# Patient Record
Sex: Male | Born: 1955 | Race: Black or African American | Hispanic: No | Smoking: Former smoker
Health system: Southern US, Community
[De-identification: ages and names within clinical notes are randomized; demographics above are authoritative.]

## PROBLEM LIST (undated history)

## (undated) DIAGNOSIS — Z8051 Family history of malignant neoplasm of kidney: Secondary | ICD-10-CM

## (undated) DIAGNOSIS — C801 Malignant (primary) neoplasm, unspecified: Secondary | ICD-10-CM

## (undated) DIAGNOSIS — N189 Chronic kidney disease, unspecified: Secondary | ICD-10-CM

## (undated) HISTORY — PX: NEPHRECTOMY: SHX65

---

## 2003-10-12 ENCOUNTER — Emergency Department (HOSPITAL_COMMUNITY): Admission: EM | Admit: 2003-10-12 | Discharge: 2003-10-12 | Payer: Self-pay | Admitting: Emergency Medicine

## 2004-05-31 DIAGNOSIS — C801 Malignant (primary) neoplasm, unspecified: Secondary | ICD-10-CM

## 2004-05-31 HISTORY — DX: Malignant (primary) neoplasm, unspecified: C80.1

## 2004-05-31 HISTORY — PX: OTHER SURGICAL HISTORY: SHX169

## 2006-05-31 HISTORY — PX: OTHER SURGICAL HISTORY: SHX169

## 2007-06-29 ENCOUNTER — Inpatient Hospital Stay (HOSPITAL_COMMUNITY): Admission: EM | Admit: 2007-06-29 | Discharge: 2007-07-11 | Payer: Self-pay | Admitting: Emergency Medicine

## 2007-07-06 ENCOUNTER — Other Ambulatory Visit: Payer: Self-pay | Admitting: Urology

## 2007-07-06 ENCOUNTER — Encounter (INDEPENDENT_AMBULATORY_CARE_PROVIDER_SITE_OTHER): Payer: Self-pay | Admitting: Urology

## 2007-07-07 ENCOUNTER — Other Ambulatory Visit: Payer: Self-pay | Admitting: Urology

## 2007-07-08 ENCOUNTER — Other Ambulatory Visit: Payer: Self-pay | Admitting: Urology

## 2007-07-10 ENCOUNTER — Ambulatory Visit: Payer: Self-pay | Admitting: Oncology

## 2007-07-19 ENCOUNTER — Ambulatory Visit: Payer: Self-pay | Admitting: Internal Medicine

## 2007-07-20 LAB — CBC WITH DIFFERENTIAL/PLATELET
Basophils Absolute: 0 10*3/uL (ref 0.0–0.1)
EOS%: 1.9 % (ref 0.0–7.0)
Eosinophils Absolute: 0.2 10*3/uL (ref 0.0–0.5)
HCT: 35.2 % — ABNORMAL LOW (ref 38.7–49.9)
HGB: 12.3 g/dL — ABNORMAL LOW (ref 13.0–17.1)
LYMPH%: 16.3 % (ref 14.0–48.0)
MCH: 33.1 pg (ref 28.0–33.4)
MCV: 94.3 fL (ref 81.6–98.0)
MONO%: 7.7 % (ref 0.0–13.0)
NEUT#: 7.2 10*3/uL — ABNORMAL HIGH (ref 1.5–6.5)
NEUT%: 74 % (ref 40.0–75.0)
Platelets: 318 10*3/uL (ref 145–400)

## 2007-07-20 LAB — COMPREHENSIVE METABOLIC PANEL
AST: 20 U/L (ref 0–37)
Albumin: 4 g/dL (ref 3.5–5.2)
Alkaline Phosphatase: 85 U/L (ref 39–117)
BUN: 15 mg/dL (ref 6–23)
Creatinine, Ser: 1.65 mg/dL — ABNORMAL HIGH (ref 0.40–1.50)
Glucose, Bld: 71 mg/dL (ref 70–99)
Potassium: 4.3 mEq/L (ref 3.5–5.3)

## 2007-07-24 ENCOUNTER — Ambulatory Visit: Payer: Self-pay | Admitting: *Deleted

## 2007-07-27 ENCOUNTER — Ambulatory Visit: Payer: Self-pay | Admitting: Thoracic Surgery

## 2007-08-01 ENCOUNTER — Ambulatory Visit (HOSPITAL_COMMUNITY): Admission: RE | Admit: 2007-08-01 | Discharge: 2007-08-01 | Payer: Self-pay | Admitting: Thoracic Surgery

## 2007-08-02 ENCOUNTER — Ambulatory Visit: Payer: Self-pay | Admitting: Thoracic Surgery

## 2007-08-07 ENCOUNTER — Emergency Department (HOSPITAL_COMMUNITY): Admission: EM | Admit: 2007-08-07 | Discharge: 2007-08-07 | Payer: Self-pay | Admitting: Emergency Medicine

## 2007-08-10 ENCOUNTER — Ambulatory Visit: Payer: Self-pay | Admitting: Internal Medicine

## 2007-08-10 LAB — CONVERTED CEMR LAB
ALT: 17 units/L (ref 0–53)
BUN: 13 mg/dL (ref 6–23)
CO2: 27 meq/L (ref 19–32)
Calcium: 9.2 mg/dL (ref 8.4–10.5)
Chloride: 102 meq/L (ref 96–112)
Cholesterol: 179 mg/dL (ref 0–200)
Creatinine, Ser: 1.29 mg/dL (ref 0.40–1.50)
Hep B Core Total Ab: NEGATIVE
Hep B E Ab: NEGATIVE
Total CHOL/HDL Ratio: 4.1

## 2007-08-15 ENCOUNTER — Encounter: Payer: Self-pay | Admitting: Thoracic Surgery

## 2007-08-15 ENCOUNTER — Inpatient Hospital Stay (HOSPITAL_COMMUNITY): Admission: RE | Admit: 2007-08-15 | Discharge: 2007-08-20 | Payer: Self-pay | Admitting: Thoracic Surgery

## 2007-08-16 ENCOUNTER — Ambulatory Visit: Payer: Self-pay | Admitting: Oncology

## 2007-08-17 ENCOUNTER — Ambulatory Visit: Payer: Self-pay | Admitting: Thoracic Surgery

## 2007-08-24 ENCOUNTER — Encounter: Admission: RE | Admit: 2007-08-24 | Discharge: 2007-08-24 | Payer: Self-pay | Admitting: Thoracic Surgery

## 2007-08-24 ENCOUNTER — Ambulatory Visit: Payer: Self-pay | Admitting: Thoracic Surgery

## 2007-09-07 LAB — CBC WITH DIFFERENTIAL/PLATELET
BASO%: 0.2 % (ref 0.0–2.0)
Basophils Absolute: 0 10*3/uL (ref 0.0–0.1)
HCT: 36.3 % — ABNORMAL LOW (ref 38.7–49.9)
HGB: 12.4 g/dL — ABNORMAL LOW (ref 13.0–17.1)
LYMPH%: 19 % (ref 14.0–48.0)
MCHC: 34.2 g/dL (ref 32.0–35.9)
MONO#: 0.7 10*3/uL (ref 0.1–0.9)
NEUT%: 70 % (ref 40.0–75.0)
Platelets: 352 10*3/uL (ref 145–400)
WBC: 8.3 10*3/uL (ref 4.0–10.0)
lymph#: 1.6 10*3/uL (ref 0.9–3.3)

## 2007-09-07 LAB — COMPREHENSIVE METABOLIC PANEL
ALT: 15 U/L (ref 0–53)
AST: 12 U/L (ref 0–37)
Alkaline Phosphatase: 77 U/L (ref 39–117)
BUN: 19 mg/dL (ref 6–23)
Calcium: 9.6 mg/dL (ref 8.4–10.5)
Chloride: 103 mEq/L (ref 96–112)
Creatinine, Ser: 1.89 mg/dL — ABNORMAL HIGH (ref 0.40–1.50)

## 2007-09-14 ENCOUNTER — Ambulatory Visit (HOSPITAL_COMMUNITY): Admission: RE | Admit: 2007-09-14 | Discharge: 2007-09-14 | Payer: Self-pay | Admitting: Thoracic Surgery

## 2007-09-14 ENCOUNTER — Ambulatory Visit: Payer: Self-pay | Admitting: Thoracic Surgery

## 2007-09-25 ENCOUNTER — Encounter: Payer: Self-pay | Admitting: Family Medicine

## 2007-09-25 ENCOUNTER — Ambulatory Visit: Payer: Self-pay | Admitting: Family Medicine

## 2007-09-25 LAB — CONVERTED CEMR LAB
Eosinophils Absolute: 0.2 10*3/uL (ref 0.0–0.7)
Eosinophils Relative: 3 % (ref 0–5)
HCT: 34.2 % — ABNORMAL LOW (ref 39.0–52.0)
Hemoglobin: 11.4 g/dL — ABNORMAL LOW (ref 13.0–17.0)
Lymphs Abs: 1.5 10*3/uL (ref 0.7–4.0)
MCV: 88.8 fL (ref 78.0–100.0)
Monocytes Absolute: 0.6 10*3/uL (ref 0.1–1.0)
Monocytes Relative: 7 % (ref 3–12)
Platelets: 287 10*3/uL (ref 150–400)
WBC: 7.6 10*3/uL (ref 4.0–10.5)

## 2007-10-19 ENCOUNTER — Ambulatory Visit (HOSPITAL_BASED_OUTPATIENT_CLINIC_OR_DEPARTMENT_OTHER): Admission: RE | Admit: 2007-10-19 | Discharge: 2007-10-19 | Payer: Self-pay | Admitting: General Surgery

## 2007-11-01 ENCOUNTER — Ambulatory Visit (HOSPITAL_COMMUNITY): Admission: RE | Admit: 2007-11-01 | Discharge: 2007-11-01 | Payer: Self-pay | Admitting: Thoracic Surgery

## 2007-11-01 ENCOUNTER — Ambulatory Visit: Payer: Self-pay | Admitting: Thoracic Surgery

## 2007-11-17 ENCOUNTER — Emergency Department (HOSPITAL_COMMUNITY): Admission: EM | Admit: 2007-11-17 | Discharge: 2007-11-17 | Payer: Self-pay | Admitting: Emergency Medicine

## 2007-11-27 ENCOUNTER — Ambulatory Visit (HOSPITAL_COMMUNITY): Admission: RE | Admit: 2007-11-27 | Discharge: 2007-11-27 | Payer: Self-pay | Admitting: Orthopedic Surgery

## 2007-12-08 ENCOUNTER — Ambulatory Visit: Payer: Self-pay | Admitting: Oncology

## 2007-12-22 ENCOUNTER — Ambulatory Visit (HOSPITAL_COMMUNITY): Admission: RE | Admit: 2007-12-22 | Discharge: 2007-12-22 | Payer: Self-pay | Admitting: Oncology

## 2008-01-04 LAB — COMPREHENSIVE METABOLIC PANEL
ALT: 52 U/L (ref 0–53)
AST: 58 U/L — ABNORMAL HIGH (ref 0–37)
Albumin: 4.4 g/dL (ref 3.5–5.2)
Alkaline Phosphatase: 60 U/L (ref 39–117)
BUN: 19 mg/dL (ref 6–23)
CO2: 27 mEq/L (ref 19–32)
Calcium: 9.3 mg/dL (ref 8.4–10.5)
Chloride: 100 mEq/L (ref 96–112)
Creatinine, Ser: 1.53 mg/dL — ABNORMAL HIGH (ref 0.40–1.50)
Glucose, Bld: 100 mg/dL — ABNORMAL HIGH (ref 70–99)
Potassium: 4.6 mEq/L (ref 3.5–5.3)
Sodium: 139 mEq/L (ref 135–145)
Total Bilirubin: 1.1 mg/dL (ref 0.3–1.2)
Total Protein: 7.3 g/dL (ref 6.0–8.3)

## 2008-01-04 LAB — CBC WITH DIFFERENTIAL/PLATELET
BASO%: 0.2 % (ref 0.0–2.0)
HCT: 37.3 % — ABNORMAL LOW (ref 38.7–49.9)
LYMPH%: 13 % — ABNORMAL LOW (ref 14.0–48.0)
MCH: 31.9 pg (ref 28.0–33.4)
MCHC: 34.3 g/dL (ref 32.0–35.9)
MCV: 93 fL (ref 81.6–98.0)
MONO%: 8.6 % (ref 0.0–13.0)
NEUT%: 77 % — ABNORMAL HIGH (ref 40.0–75.0)
Platelets: 231 10*3/uL (ref 145–400)
RBC: 4.01 10*6/uL — ABNORMAL LOW (ref 4.20–5.71)

## 2008-01-04 LAB — LACTATE DEHYDROGENASE: LDH: 157 U/L (ref 94–250)

## 2008-02-25 ENCOUNTER — Emergency Department (HOSPITAL_COMMUNITY): Admission: EM | Admit: 2008-02-25 | Discharge: 2008-02-25 | Payer: Self-pay | Admitting: Emergency Medicine

## 2008-04-30 ENCOUNTER — Encounter: Admission: RE | Admit: 2008-04-30 | Discharge: 2008-04-30 | Payer: Self-pay | Admitting: Internal Medicine

## 2008-07-09 ENCOUNTER — Ambulatory Visit: Payer: Self-pay | Admitting: Oncology

## 2009-06-30 IMAGING — CT NM PET TUM IMG SKULL BASE T - THIGH
6 series · 25 of 25 positions shown · IV contrast ([ID])
Comparison: CT dated 06/28/07.

CLINICAL DATA: Assess lung mass. 
 FDG PET-CT TUMOR IMAGING (SKULL BASE TO THIGHS) ? 08/01/07: 
 Fasting Blood Glucose:  99.
TECHNIQUE: 18.0 mCi F18-FDG were administered via the right antecubital fossa.  Full ring PET imaging was performed from the skull base through the mid-thighs 60 minutes after injection.  CT data was obtained and used for attenuation correction and anatomic localization only.  (This was not acquired as a diagnostic CT examination.)

[Series 1: pet ac · axial · 3.3mm · 4.69mm/px · z∈[-870,+0]mm · 5 of 267 slices shown]
[im 1/267]
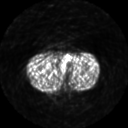
[im 67/267]
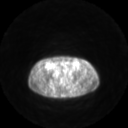
[im 134/267]
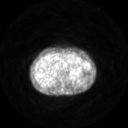
[im 200/267]
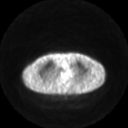
[im 267/267]
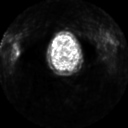

[Series 2: ct images · axial · 3.8mm · 0.98mm/px · z∈[-870,+0]mm · 6 of 267 slices shown]
[im 1/267]
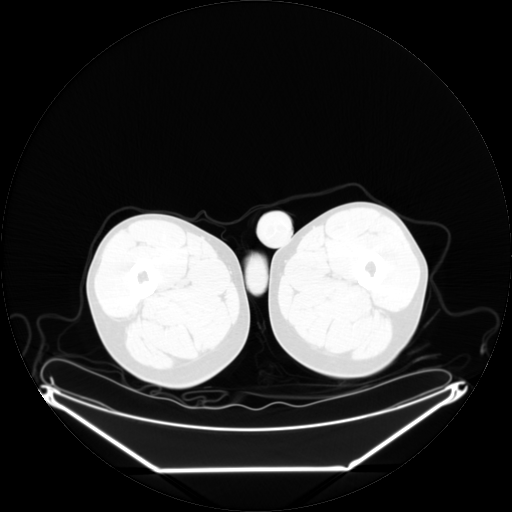
[im 54/267]
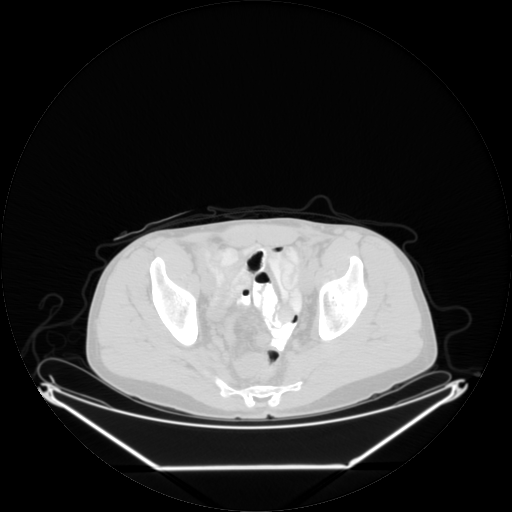
[im 107/267]
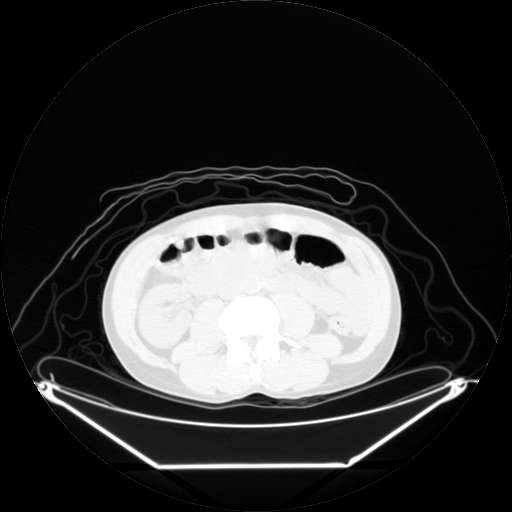
[im 160/267]
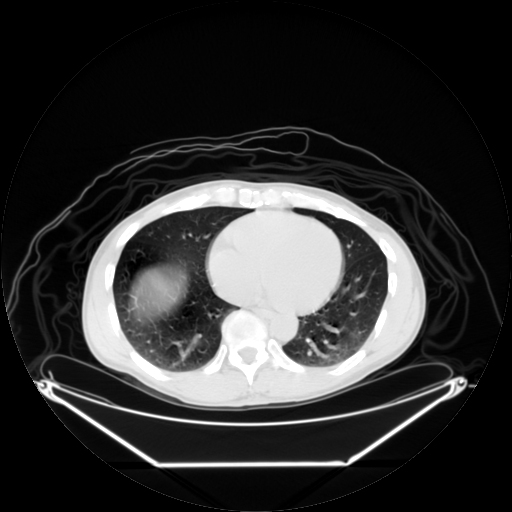
[im 213/267]
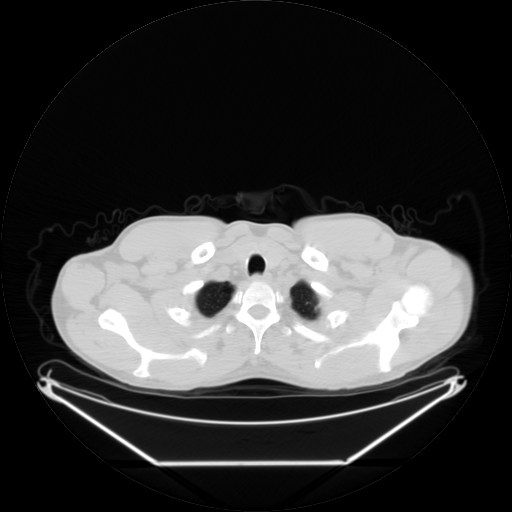
[im 267/267  brain]
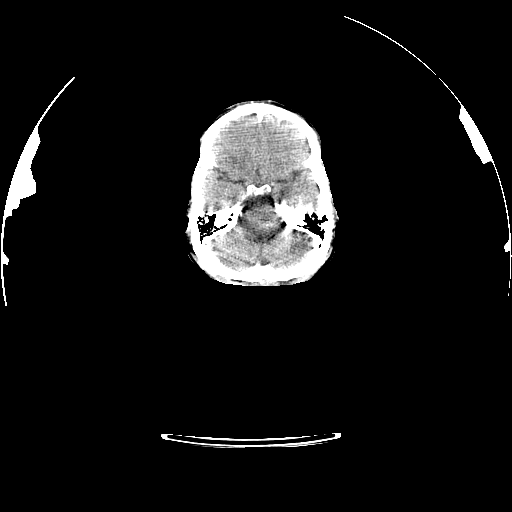

[Series 2: pet nac · axial · 3.3mm · 4.69mm/px · z∈[-870,+0]mm · 6 of 267 slices shown]
[im 1/267]
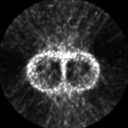
[im 54/267]
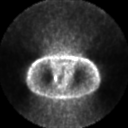
[im 107/267]
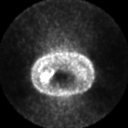
[im 160/267]
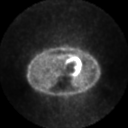
[im 213/267]
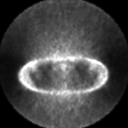
[im 267/267]
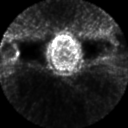

[Series 123: mip · coronal · 3.3mm · 4.69mm/px · 1 of 30 slices shown]
[im 1/30]
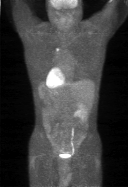

[Series 151: reformatted · axial · 3.3mm · 3.91mm/px · z∈[-870,+0]mm · 6 of 265 slices shown (1 of 2)]
[im 1/265]
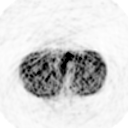
[im 53/265]
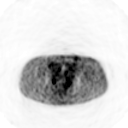
[im 106/265]
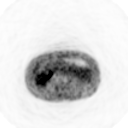
[im 159/265]
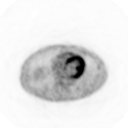
[im 212/265]
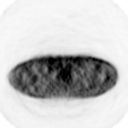
[im 265/265]
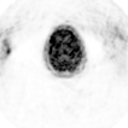

[Series 153: reformatted · coronal · 4.7mm · 6.98mm/px · 1 of 68 slices shown (2 of 2)]
[im 1/68]
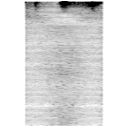

[25 of 25 positions shown; findings below may reference images not displayed]

FINDINGS: There are no hypermetabolic lymph nodes within the soft tissues of the neck.
 There are no hypermetabolic axillary lymph nodes.  
 No hypermetabolic mediastinal or hilar lymph nodes are identified.
 Within the left upper lobe, there is a pulmonary nodule measuring 1.6 cm.  There is abnormal FDG uptake within this nodule with an SUV-max equal to 6.4. 
 There are no additional hypermetabolic pulmonary nodules or masses identified.
 There is no abnormal FDG uptake identified within the liver parenchyma.
 The spleen is negative.
 The adrenal glands are negative.
 The patient is status post left nephrectomy.  
 No hypermetabolic retroperitoneal lymph nodes are identified. 
 There are no hypermetabolic pelvic, or inguinal lymph nodes identified.  
 Review of the visualized axial and appendicular skeleton shows no evidence for hypermetabolic bone metastases.
IMPRESSION: 1.  Pulmonary nodule in the left upper lobe is intensely hypermetabolic concerning for primary lung neoplasm.
 2.  Status post left nephrectomy.

## 2010-06-21 ENCOUNTER — Encounter: Payer: Self-pay | Admitting: Oncology

## 2010-06-22 ENCOUNTER — Encounter: Payer: Self-pay | Admitting: Thoracic Surgery

## 2010-06-22 ENCOUNTER — Encounter: Payer: Self-pay | Admitting: Oncology

## 2010-10-13 NOTE — Assessment & Plan Note (Signed)
OFFICE VISIT   ABDISHAKUR, GOTTSCHALL  DOB:  Jul 27, 1955                                        August 24, 2007  CHART #:  [id number]   [name] returns today after having his wedge resection for a  granulomatous process.  His blood pressure was 152/80, pulse 78,  respirations 18, sats are 100.  His incisions are well healed.  He is  doing well overall.  I will see him back again in 3 weeks with a chest x-  ray and will be seen about his hernia next week.   Ines Bloomer, M.D.  Electronically Signed   DPB/MEDQ  D:  08/24/2007  T:  08/24/2007  Job:  [id number]
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
## 2010-10-13 NOTE — Assessment & Plan Note (Signed)
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  He is on  antituberculous medication.  His incisions are well healed.  His chest x-  ray is stable.   His blood pressure is 128/87, pulse 65, respirations 18, saturations  95%.   I plan to see him back again in 6 weeks with a chest x-ray for final  check.   Ines Bloomer, M.D.  Electronically Signed   DPB/MEDQ  D:  09/14/2007  T:  09/14/2007  Job:  [id number]
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
## 2010-10-13 NOTE — Op Note (Signed)
NAME:  Jeremy [name], Jeremy [name] NO.:  [id number]   MEDICAL RECORD NO.:  [id number]          PATIENT TYPE:  INP   LOCATION:  2550                         FACILITY:  [facility]   PHYSICIAN:  Ines Bloomer, M.D. DATE OF BIRTH:  [birth year]   DATE OF PROCEDURE:  08/15/2007  DATE OF DISCHARGE:                               OPERATIVE REPORT   PREOPERATIVE DIAGNOSIS:  Left upper lobe mass status post resection of  renal cell cancer.   POSTOPERATIVE DIAGNOSIS:  Granulomatous process left upper lobe.   OPERATION PERFORMED:  1. Left VATS wedge resection of the left upper lobe.  2. Mini thoracotomy.  3. Node dissection.   SURGEON:  Dorita Sciara, MD.   FIRST ASSISTANT:  Zadie Rhine, PA-C.   ANESTHESIA:  General.   After percutaneous insertion of all monitoring lines, the patient  underwent general anesthesia, was prepped and draped in the usual  sterile manner.  He was turned to the left lateral thoracotomy position.  A dual lumen tube was inserted.  The left lung was deflated.  Two trocar  sites were made in the anterior and posterior axillary line at the  seventh intercostal space, two trocars were inserted.  Notably, the  patient had evidence of chronic obstructive pulmonary disease but no  evidence of any pleural mets so a 7 cm incision was made over the  triangle with auscultation, partially dividing the latissimus and  entering the fifth intercostal space.  The lesion was palpated in the  anterior segment of the left upper lobe and we wedged this out with  several applications of the Auto Suture 60 green stapler.  This was sent  for frozen section.  We then biopsied several lymph nodes and removed  several lymph nodes including 4L, 11L, 8L, 9L and 10L nodes.  The frozen  section came back a granulomatous process and for this reason we elected  not to do anything else.  CoSeal was applied to the staple line.  Two  chest tubes were placed through the trocar sites and  tied in place with  #0 silk.  A Marcaine block was done in the usual fashion.  A single On-Q  was inserted in the usual fashion.  The chest was closed with two  pericostals, drilling through the 6th rib and passing around the 5th  rib, #1 Vicryl in the muscle layer, #2-0 Vicryl in the subcutaneous  tissue and Dermabond for the skin.  The patient was returned to the  recovery room in stable condition.      Ines Bloomer, M.D.  Electronically Signed    DPB/MEDQ  D:  08/15/2007  T:  08/15/2007  Job:  [id number]   cc:   Lonzo Candy, Dr.
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
## 2010-10-13 NOTE — Assessment & Plan Note (Signed)
OFFICE VISIT   KLEVER, TWYFORD  DOB:  1955-06-28                                        November 01, 2007  CHART #:  [id number]   Mr. Copado came for followup today.  He is still complaining of some  chest wall pain.  His blood pressure is 122/89, pulse 84, respirations  16, and sats were 99%.  Overall, he is doing well.  I will plan to see  him back again in 3 months with a chest x-ray.  He is continuing his TB  treatment.   Ines Bloomer, M.D.  Electronically Signed   DPB/MEDQ  D:  11/01/2007  T:  11/02/2007  Job:  [id number]

## 2010-10-13 NOTE — Letter (Signed)
August 02, 2007   Blenda Nicely. Clelia Croft, MD.  [address] [place], [place].  [place], [place], [address]   Re:  Jeremy Derry                  DOB:   Dear Dr. Clelia Croft:   I saw Jeremy [name] back in the office today, and his PET scan is positive  for the left upper lobe lesion so I think he does have probably either a  metastatic or a non-small cell lung cancer.  He is a candidate for  probably a left upper lobe apical trisegmentectomy or a left upper  lobectomy.  I have tentatively scheduled this for the 17th of March.  Because he is in an assisted living situation and because he was  homeless, we are planning to hopefully get this extended until we can do  his surgery on the 17th and then we will have to make arrangements for  him to go somewhere after his surgery.  I will inform you after his  surgery about the operative findings.  His blood pressure was 155/95,  pulse 72, respirations 18, SATs were 99%.   Ines Bloomer, M.D.  Electronically Signed   DPB/MEDQ  D:  08/02/2007  T:  08/02/2007  Job:  [id number]

## 2010-10-13 NOTE — Op Note (Signed)
NAME:  Jeremy, [name] NO.:  [id number]   MEDICAL RECORD NO.:  [id number]          PATIENT TYPE:  AMB   LOCATION:  DSC                          FACILITY:  [facility]   PHYSICIAN:  Cherylynn Ridges, M.D.    DATE OF BIRTH:  11/11/1955   DATE OF PROCEDURE:  10/19/2007  DATE OF DISCHARGE:                               OPERATIVE REPORT   PREOPERATIVE DIAGNOSIS:  Right inguinal hernia.   POSTOPERATIVE DIAGNOSIS:  Right indirect inguinal hernia.   PROCEDURE:  Right inguinal hernia repair with mesh.   SURGEON:  Marta Lamas. Lindie Spruce, MD.   ANESTHESIA:  General with a laryngeal airway.   ESTIMATED BLOOD LOSS:  Less than 20 mL.   COMPLICATIONS:  None.   CONDITION:  Stable.   FINDINGS:  Large indirect hernia with mild direct defect.   INDICATION FOR OPERATION:  The patient is a 55 year old gentleman with a  previous left inguinal hernia repair, who comes in now with a right  inguinal hernia for repair.   OPERATION:  The patient was taken to the operating room and placed on  table in the supine position.  After an adequate general laryngeal  airway anesthetic was administered, he was prepped and draped in the  usual sterile manner exposing the right groin.   A transverse curvilinear incision, approximately 6 cm long, was made  using a #10 blade and taken down to the subcutaneous tissue.  We  dissected down to the external oblique fascia through Scarpa fascia.  We  opened the external oblique fascia through the superficial ring using  Metzenbaum scissors.  We mobilized spermatic cord at the pubic tubercle  with a Penrose drain.  There did not appeared to be a large direct  defect.  We placed the spermatic cord up on a workbench on top of the  Penrose drain and then dissected out the hernia sac anteromedially.  We  ligated the hernia sac at its base with a 2 suture ligatures of 0  Ethibond suture.  We transected the excess sac and allowed it to retract  underneath the muscle at  the internal ring.  We then sutured in a piece  of oval mesh measuring 2 x 5-cm in size to the pubic tubercle, the  conjoined tendon anteromedially and the reflected portion of the  inguinal ligament inferolaterally.  As this was done with 0 Prolene  sutures, we sutured it up to the internal ring.  After the mesh was in  place, we irrigated with antibiotic solution in which it had been soaked  prior to implanting.  Once this was done, we closed the external oblique  fascia on top of the cord, and repaired using a running 3-0 Vicryl  suture.  Scarpa fascia was reapproximated with interrupted 3-0 Vicryl  sutures.  A 0.5% Marcaine without epinephrine was  injected into the wound and at the anterior superior iliac spine,  approximately 20 mL total were used.  We then closed the skin using  running subcuticular suture of 4-0 Monocryl.  A sterile dressing was  applied including Dermabond, Steri-Strips, and Tegaderm.  All needle  counts, sponge counts, and instrument counts were correct.      Cherylynn Ridges, M.D.  Electronically Signed     JOW/MEDQ  D:  10/19/2007  T:  10/20/2007  Job:  [id number]   cc:   Sharin Grave, MD

## 2010-10-13 NOTE — Assessment & Plan Note (Signed)
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  He has been  [facility] and started on antituberculosis drugs  and will be apparently followed up by their pulmonologist.  They are  checking a sputum on him, but I think this was a nodule.  I doubt if he  was coughing up any infected sputum.  Will see him back again as  scheduled.   Ines Bloomer, M.D.  Electronically Signed   DPB/MEDQ  D:  09/11/2007  T:  09/11/2007  Job:  [id number]

## 2010-10-13 NOTE — Op Note (Signed)
NAME:  Jeremy [name], Jeremy [name]                ACCOUNT NO.:  [id number]   MEDICAL RECORD NO.:  [id number]          PATIENT TYPE:  AMB   LOCATION:  SDS                          FACILITY:  [facility]   PHYSICIAN:  Artist Pais. Weingold, M.D.DATE OF BIRTH:  [birth year]   DATE OF PROCEDURE:  11/27/2007  DATE OF DISCHARGE:  11/27/2007                               OPERATIVE REPORT   PREOPERATIVE DIAGNOSIS:  Left wrist volar laceration, ulnar side.   POSTOPERATIVE DIAGNOSES:  Left wrist volar laceration, ulnar side.   PROCEDURE:  Exploration and repair microscopically, left ulnar nerve at  the wrist and flexor carpi ulnaris tendon.   SURGEON:  Artist Pais. Mina Marble, MD   ASSISTANT:  None.   ANESTHESIA:  General.   TOURNIQUET TIME:  1 hour 3 minutes.   COMPLICATIONS:  None.   DRAINS:  None.   OPERATIVE REPORT:  The patient was taken to the operating suite.  After  induction of adequate general anesthesia, left upper extremity was  prepped and draped in sterile fashion.  An Esmarch was used to  exsanguinated the limb.  Tourniquet was inflated to 250 mm.  At this  point in time, a small transverse laceration just proximal to the wrist  flexion crease on the ulnar side of the wrist was extended proximally  and distally in a gentle S-type fashion.  Flaps were raised accordingly.  Dissection was carried down to the ulnar side of the wrist.  There was a  90% laceration of the flexor carpi ulnaris tendon, again just proximal  to the wrist and a laceration to the ulnar nerve at that same level.  The FCU was carefully retracted to expose the neurovascular bundle.  Neurovascular bundle was dissected free on the ulnar side.  The ulnar  artery was intact, however, the ulnar nerve had a significant complex  laceration.  The ulnar nerve was carefully dissected into 3 main  fascicles, 2 of which he had complete lacerations and 1 had a partial  laceration.  Under loupe magnification, dissection was carried down to  normal tissue proximally and distally.  At this point in time, the  microscope was brought into the field and the complete lacerations  repaired with 9-0 nylon x3 sutures to each fascicles followed by  debridement and completion of the partial nerve injury to the last  fascicle.  This was followed by repair using nylon as well.  The wound  was then irrigated.  The microscope was brought off the field.  The  flexor carpi ulnaris tendon was repaired with 3-0 Ethibond suture and 3  horizontal  mattress sutures followed by irrigation of the wound, hemostasis with  bipolar cautery, and wound closure with 4-0 Vicryl Rapide sutures.  Xeroform, 4x4s, and dorsal extension block splint was applied with the  wrist in flexion to protect the repairs.  The patient tolerated the  procedure well and went to recovery room in stable fashion.      Artist Pais Mina Marble, M.D.  Electronically Signed     MAW/MEDQ  D:  11/27/2007  T:  11/28/2007  Job:  [id number]
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
## 2011-02-18 LAB — URINALYSIS, ROUTINE W REFLEX MICROSCOPIC
Bilirubin Urine: NEGATIVE
Glucose, UA: NEGATIVE
Ketones, ur: NEGATIVE
Leukocytes, UA: NEGATIVE
Protein, ur: 30 — AB

## 2011-02-18 LAB — COMPREHENSIVE METABOLIC PANEL
ALT: 26
CO2: 29
Calcium: 8.5
Creatinine, Ser: 0.84
GFR calc non Af Amer: >60
Glucose, Bld: 103 — ABNORMAL HIGH

## 2011-02-18 LAB — CBC
HCT: 32.8 — ABNORMAL LOW
HCT: 36.1 — ABNORMAL LOW
Hemoglobin: 11.4 — ABNORMAL LOW
Hemoglobin: 12.6 — ABNORMAL LOW
MCHC: 35
MCV: 96.1
MCV: 97.1
Platelets: 178
Platelets: 179
RBC: 3.75 — ABNORMAL LOW
RDW: 15.7 — ABNORMAL HIGH
RDW: 15.8 — ABNORMAL HIGH
WBC: 13.6 — ABNORMAL HIGH
WBC: 8.7

## 2011-02-18 LAB — POCT CARDIAC MARKERS
Myoglobin, poc: 149
Operator id: 234501

## 2011-02-18 LAB — CARDIAC PANEL(CRET KIN+CKTOT+MB+TROPI)
CK, MB: 2.5
Relative Index: 1.4
Total CK: 176
Troponin I: 0.01

## 2011-02-18 LAB — ETHANOL: Alcohol, Ethyl (B): 283 — ABNORMAL HIGH

## 2011-02-18 LAB — BASIC METABOLIC PANEL
BUN: 4 — ABNORMAL LOW
BUN: 5 — ABNORMAL LOW
Calcium: 8.5
Chloride: 104
Creatinine, Ser: 0.84
GFR calc non Af Amer: >60
Glucose, Bld: 176 — ABNORMAL HIGH
Potassium: 3.4 — ABNORMAL LOW
Potassium: 3.5
Sodium: 138

## 2011-02-18 LAB — CK TOTAL AND CKMB (NOT AT ARMC)
CK, MB: 2.9
Relative Index: 1.6
Total CK: 186

## 2011-02-18 LAB — URINE MICROSCOPIC-ADD ON

## 2011-02-18 LAB — DIFFERENTIAL
Eosinophils Absolute: 0
Lymphocytes Relative: 11 — ABNORMAL LOW
Lymphs Abs: 0.9
Neutrophils Relative %: 82 — ABNORMAL HIGH

## 2011-02-18 LAB — TROPONIN I: Troponin I: <0.01

## 2011-02-19 LAB — COMPREHENSIVE METABOLIC PANEL
AST: 110 — ABNORMAL HIGH
AST: 148 — ABNORMAL HIGH
Albumin: 3 — ABNORMAL LOW
Albumin: 3.1 — ABNORMAL LOW
Alkaline Phosphatase: 68
BUN: 9
Calcium: 8.8
Chloride: 102
Creatinine, Ser: 0.8
GFR calc Af Amer: >60
GFR calc Af Amer: >60
Potassium: 3.5
Total Bilirubin: 0.5

## 2011-02-19 LAB — BASIC METABOLIC PANEL
CO2: 28
CO2: 30
CO2: 30
Calcium: 8.4
Calcium: 8.2 — ABNORMAL LOW
Calcium: 8.4
Chloride: 100
Chloride: 104
Chloride: 106
Creatinine, Ser: 1.5
GFR calc Af Amer: >60
GFR calc Af Amer: 59 — ABNORMAL LOW
GFR calc Af Amer: >60
GFR calc Af Amer: >60
GFR calc non Af Amer: 49 — ABNORMAL LOW
GFR calc non Af Amer: >60
Potassium: 4.2
Sodium: 134 — ABNORMAL LOW
Sodium: 135
Sodium: 139

## 2011-02-19 LAB — ABO/RH: ABO/RH(D): O POS

## 2011-02-19 LAB — CBC
HCT: 37.6 — ABNORMAL LOW
HCT: 32.3 — ABNORMAL LOW
Hemoglobin: 11.9 — ABNORMAL LOW
Hemoglobin: 11.3 — ABNORMAL LOW
MCHC: 33.7
MCHC: 34.6
MCHC: 34.9
MCV: 97.1
MCV: 98.5
MCV: 96.4
MCV: 96.7
Platelets: 242
Platelets: 249
Platelets: 247
Platelets: 267
RBC: 3.55 — ABNORMAL LOW
RBC: 3.84 — ABNORMAL LOW
RBC: 3.35 — ABNORMAL LOW
RDW: 15.9 — ABNORMAL HIGH
RDW: 15.1
RDW: 15.2
WBC: 10.7 — ABNORMAL HIGH
WBC: 10.7 — ABNORMAL HIGH
WBC: 11.4 — ABNORMAL HIGH

## 2011-02-19 LAB — TYPE AND SCREEN: Antibody Screen: NEGATIVE

## 2011-02-22 LAB — TYPE AND SCREEN: ABO/RH(D): O POS

## 2011-02-22 LAB — TISSUE CULTURE

## 2011-02-22 LAB — CBC
HCT: 35.5 — ABNORMAL LOW
HCT: 30.9 — ABNORMAL LOW
Hemoglobin: 12 — ABNORMAL LOW
Hemoglobin: 10.4 — ABNORMAL LOW
Hemoglobin: 10.4 — ABNORMAL LOW
Hemoglobin: 11.1 — ABNORMAL LOW
Platelets: 215
Platelets: 212
RBC: 3.28 — ABNORMAL LOW
RBC: 3.3 — ABNORMAL LOW
RDW: 13.7
RDW: 13.3
RDW: 13.5
WBC: 8.6
WBC: 11.2 — ABNORMAL HIGH

## 2011-02-22 LAB — URINALYSIS, ROUTINE W REFLEX MICROSCOPIC
Protein, ur: NEGATIVE
Urobilinogen, UA: 0.2

## 2011-02-22 LAB — BLOOD GAS, ARTERIAL
Bicarbonate: 26.5 — ABNORMAL HIGH
FIO2: 0.21
O2 Content: 2
O2 Saturation: 92.8
Sample type: 181601
TCO2: 27.9
pCO2 arterial: 45.5 — ABNORMAL HIGH
pCO2 arterial: 48.6 — ABNORMAL HIGH
pH, Arterial: 7.384
pO2, Arterial: 66.8 — ABNORMAL LOW

## 2011-02-22 LAB — BASIC METABOLIC PANEL
Calcium: 8.7
Creatinine, Ser: 1.27
GFR calc Af Amer: >60
GFR calc Af Amer: >60
GFR calc non Af Amer: 59 — ABNORMAL LOW
GFR calc non Af Amer: >60
Glucose, Bld: 118 — ABNORMAL HIGH
Potassium: 4.1
Sodium: 136
Sodium: 136

## 2011-02-22 LAB — COMPREHENSIVE METABOLIC PANEL
ALT: 17
ALT: 15
Albumin: 3.5
Alkaline Phosphatase: 61
Alkaline Phosphatase: 54
BUN: 15
BUN: 7
CO2: 31
Chloride: 103
Chloride: 102
Glucose, Bld: 64 — ABNORMAL LOW
Glucose, Bld: 123 — ABNORMAL HIGH
Potassium: 4.5
Potassium: 3.7
Sodium: 136
Sodium: 136
Total Bilirubin: 0.4
Total Bilirubin: 0.6
Total Protein: 6.3

## 2011-02-22 LAB — AFB CULTURE WITH SMEAR (NOT AT ARMC): Acid Fast Smear: NONE SEEN

## 2011-02-22 LAB — PROTIME-INR
INR: 0.9
Prothrombin Time: 12.3

## 2011-02-22 LAB — FUNGUS CULTURE W SMEAR

## 2011-02-24 LAB — DIFFERENTIAL
Basophils Absolute: 0
Eosinophils Relative: 1
Lymphocytes Relative: 19
Monocytes Absolute: 0.6
Monocytes Relative: 11
Neutro Abs: 3.7

## 2011-02-24 LAB — BASIC METABOLIC PANEL
CO2: 27
Calcium: 9.3
GFR calc Af Amer: >60
GFR calc non Af Amer: >60
Glucose, Bld: 93
Potassium: 4.8
Sodium: 136

## 2011-02-24 LAB — CBC
HCT: 36.9 — ABNORMAL LOW
Hemoglobin: 12.7 — ABNORMAL LOW
RBC: 4.06 — ABNORMAL LOW
RDW: 14.1

## 2011-02-25 LAB — BASIC METABOLIC PANEL
BUN: 11
CO2: 29
Calcium: 9.7
Chloride: 102
Creatinine, Ser: 1.48
GFR calc Af Amer: >60
GFR calc non Af Amer: 50 — ABNORMAL LOW
Glucose, Bld: 85
Potassium: 4.4
Sodium: 139

## 2011-02-25 LAB — CBC
HCT: 37.8 — ABNORMAL LOW
Hemoglobin: 12.7 — ABNORMAL LOW
MCHC: 33.6
MCV: 94.7
Platelets: 226
RBC: 3.99 — ABNORMAL LOW
RDW: 13.6
WBC: 5.5

## 2011-03-01 LAB — CBC
Hemoglobin: 11.4 — ABNORMAL LOW
MCV: 98
RBC: 3.42 — ABNORMAL LOW
WBC: 6.6

## 2011-03-01 LAB — DIFFERENTIAL
Eosinophils Absolute: 0
Lymphs Abs: 0.7
Monocytes Absolute: 0.9
Monocytes Relative: 14 — ABNORMAL HIGH
Neutro Abs: 5
Neutrophils Relative %: 76

## 2011-03-01 LAB — BASIC METABOLIC PANEL
CO2: 26
Chloride: 105
Creatinine, Ser: 1.16
GFR calc Af Amer: >60
Sodium: 138

## 2011-03-01 LAB — RAPID URINE DRUG SCREEN, HOSP PERFORMED
Barbiturates: NOT DETECTED
Benzodiazepines: NOT DETECTED

## 2011-03-01 LAB — ETHANOL: Alcohol, Ethyl (B): <5

## 2014-11-29 DEATH — deceased

## 2015-02-28 ENCOUNTER — Emergency Department (HOSPITAL_COMMUNITY): Payer: MEDICAID

## 2015-02-28 ENCOUNTER — Inpatient Hospital Stay (HOSPITAL_COMMUNITY)
Admission: EM | Admit: 2015-02-28 | Discharge: 2015-03-10 | DRG: 438 | Disposition: A | Discharge status: Other Acute Inpatient Hospital | Payer: Self-pay | Attending: Internal Medicine | Admitting: Internal Medicine

## 2015-02-28 ENCOUNTER — Emergency Department (HOSPITAL_COMMUNITY): Payer: Self-pay

## 2015-02-28 ENCOUNTER — Encounter (HOSPITAL_COMMUNITY): Payer: Self-pay | Admitting: Emergency Medicine

## 2015-02-28 DIAGNOSIS — K852 Alcohol induced acute pancreatitis without necrosis or infection: Principal | ICD-10-CM | POA: Diagnosis present

## 2015-02-28 DIAGNOSIS — D72829 Elevated white blood cell count, unspecified: Secondary | ICD-10-CM | POA: Diagnosis present

## 2015-02-28 DIAGNOSIS — K920 Hematemesis: Secondary | ICD-10-CM | POA: Insufficient documentation

## 2015-02-28 DIAGNOSIS — Z85528 Personal history of other malignant neoplasm of kidney: Secondary | ICD-10-CM

## 2015-02-28 DIAGNOSIS — E162 Hypoglycemia, unspecified: Secondary | ICD-10-CM | POA: Diagnosis present

## 2015-02-28 DIAGNOSIS — E860 Dehydration: Secondary | ICD-10-CM | POA: Diagnosis present

## 2015-02-28 DIAGNOSIS — K7200 Acute and subacute hepatic failure without coma: Secondary | ICD-10-CM | POA: Diagnosis present

## 2015-02-28 DIAGNOSIS — E872 Acidosis, unspecified: Secondary | ICD-10-CM | POA: Diagnosis present

## 2015-02-28 DIAGNOSIS — I10 Essential (primary) hypertension: Secondary | ICD-10-CM | POA: Diagnosis present

## 2015-02-28 DIAGNOSIS — E8729 Other acidosis: Secondary | ICD-10-CM | POA: Diagnosis present

## 2015-02-28 DIAGNOSIS — K858 Other acute pancreatitis without necrosis or infection: Secondary | ICD-10-CM

## 2015-02-28 DIAGNOSIS — K921 Melena: Secondary | ICD-10-CM

## 2015-02-28 DIAGNOSIS — F1010 Alcohol abuse, uncomplicated: Secondary | ICD-10-CM | POA: Diagnosis present

## 2015-02-28 DIAGNOSIS — E861 Hypovolemia: Secondary | ICD-10-CM | POA: Diagnosis present

## 2015-02-28 DIAGNOSIS — K7011 Alcoholic hepatitis with ascites: Secondary | ICD-10-CM | POA: Diagnosis present

## 2015-02-28 DIAGNOSIS — Z902 Acquired absence of lung [part of]: Secondary | ICD-10-CM

## 2015-02-28 DIAGNOSIS — N179 Acute kidney failure, unspecified: Secondary | ICD-10-CM | POA: Diagnosis present

## 2015-02-28 DIAGNOSIS — K838 Other specified diseases of biliary tract: Secondary | ICD-10-CM

## 2015-02-28 DIAGNOSIS — K7040 Alcoholic hepatic failure without coma: Secondary | ICD-10-CM

## 2015-02-28 DIAGNOSIS — K269 Duodenal ulcer, unspecified as acute or chronic, without hemorrhage or perforation: Secondary | ICD-10-CM | POA: Diagnosis present

## 2015-02-28 DIAGNOSIS — W19XXXA Unspecified fall, initial encounter: Secondary | ICD-10-CM | POA: Diagnosis present

## 2015-02-28 DIAGNOSIS — K7010 Alcoholic hepatitis without ascites: Secondary | ICD-10-CM | POA: Diagnosis present

## 2015-02-28 DIAGNOSIS — D6959 Other secondary thrombocytopenia: Secondary | ICD-10-CM | POA: Diagnosis present

## 2015-02-28 DIAGNOSIS — Z833 Family history of diabetes mellitus: Secondary | ICD-10-CM

## 2015-02-28 DIAGNOSIS — K859 Acute pancreatitis without necrosis or infection, unspecified: Secondary | ICD-10-CM | POA: Diagnosis present

## 2015-02-28 DIAGNOSIS — R945 Abnormal results of liver function studies: Secondary | ICD-10-CM

## 2015-02-28 DIAGNOSIS — Z9114 Patient's other noncompliance with medication regimen: Secondary | ICD-10-CM

## 2015-02-28 DIAGNOSIS — Y902 Blood alcohol level of 40-59 mg/100 ml: Secondary | ICD-10-CM

## 2015-02-28 DIAGNOSIS — Z6821 Body mass index (BMI) 21.0-21.9, adult: Secondary | ICD-10-CM

## 2015-02-28 DIAGNOSIS — Z87891 Personal history of nicotine dependence: Secondary | ICD-10-CM

## 2015-02-28 DIAGNOSIS — K759 Inflammatory liver disease, unspecified: Secondary | ICD-10-CM

## 2015-02-28 DIAGNOSIS — Z905 Acquired absence of kidney: Secondary | ICD-10-CM

## 2015-02-28 DIAGNOSIS — R45851 Suicidal ideations: Secondary | ICD-10-CM | POA: Diagnosis present

## 2015-02-28 DIAGNOSIS — E43 Unspecified severe protein-calorie malnutrition: Secondary | ICD-10-CM | POA: Diagnosis present

## 2015-02-28 DIAGNOSIS — R7989 Other specified abnormal findings of blood chemistry: Secondary | ICD-10-CM

## 2015-02-28 DIAGNOSIS — Z590 Homelessness: Secondary | ICD-10-CM

## 2015-02-28 DIAGNOSIS — E876 Hypokalemia: Secondary | ICD-10-CM | POA: Diagnosis present

## 2015-02-28 DIAGNOSIS — F10239 Alcohol dependence with withdrawal, unspecified: Secondary | ICD-10-CM | POA: Diagnosis present

## 2015-02-28 HISTORY — DX: Family history of malignant neoplasm of kidney: Z80.51

## 2015-02-28 HISTORY — DX: Malignant (primary) neoplasm, unspecified: C80.1

## 2015-02-28 HISTORY — DX: Chronic kidney disease, unspecified: N18.9

## 2015-02-28 LAB — BASIC METABOLIC PANEL
ANION GAP: 23 — AB (ref 5–15)
BUN: 22 mg/dL — AB (ref 6–20)
CALCIUM: 7.4 mg/dL — AB (ref 8.9–10.3)
CO2: 13 mmol/L — ABNORMAL LOW (ref 22–32)
Chloride: 99 mmol/L — ABNORMAL LOW (ref 101–111)
Creatinine, Ser: 1.54 mg/dL — ABNORMAL HIGH (ref 0.61–1.24)
GFR calc Af Amer: 54 mL/min — ABNORMAL LOW (ref >60)
GFR, EST NON AFRICAN AMERICAN: 47 mL/min — AB (ref >60)
GLUCOSE: 111 mg/dL — AB (ref 65–99)
Potassium: 5.3 mmol/L — ABNORMAL HIGH (ref 3.5–5.1)
SODIUM: 135 mmol/L (ref 135–145)

## 2015-02-28 LAB — CBC WITH DIFFERENTIAL/PLATELET
BASOS ABS: 0 10*3/uL (ref 0.0–0.1)
Basophils Relative: 0 %
EOS ABS: 0 10*3/uL (ref 0.0–0.7)
Eosinophils Relative: 0 %
HCT: 37.8 % — ABNORMAL LOW (ref 39.0–52.0)
Hemoglobin: 13 g/dL (ref 13.0–17.0)
LYMPHS ABS: 1.8 10*3/uL (ref 0.7–4.0)
Lymphocytes Relative: 8 %
MCH: 33.8 pg (ref 26.0–34.0)
MCHC: 34.4 g/dL (ref 30.0–36.0)
MCV: 98.2 fL (ref 78.0–100.0)
MONO ABS: 1.3 10*3/uL — AB (ref 0.1–1.0)
Monocytes Relative: 6 %
NEUTROS ABS: 19.3 10*3/uL — AB (ref 1.7–7.7)
Neutrophils Relative %: 86 %
PLATELETS: 104 10*3/uL — AB (ref 150–400)
RBC: 3.85 MIL/uL — AB (ref 4.22–5.81)
RDW: 14.1 % (ref 11.5–15.5)
WBC: 22.4 10*3/uL — AB (ref 4.0–10.5)

## 2015-02-28 LAB — GLUCOSE, CAPILLARY: GLUCOSE-CAPILLARY: 144 mg/dL — AB (ref 65–99)

## 2015-02-28 LAB — COMPREHENSIVE METABOLIC PANEL
ALBUMIN: 4.2 g/dL (ref 3.5–5.0)
ALT: 566 U/L — ABNORMAL HIGH (ref 17–63)
ANION GAP: 36 — AB (ref 5–15)
AST: 2177 U/L — AB (ref 15–41)
Alkaline Phosphatase: 190 U/L — ABNORMAL HIGH (ref 38–126)
BILIRUBIN TOTAL: 1.3 mg/dL — AB (ref 0.3–1.2)
BUN: 23 mg/dL — AB (ref 6–20)
CHLORIDE: 95 mmol/L — AB (ref 101–111)
CO2: 9 mmol/L — AB (ref 22–32)
Calcium: 8.5 mg/dL — ABNORMAL LOW (ref 8.9–10.3)
Creatinine, Ser: 1.71 mg/dL — ABNORMAL HIGH (ref 0.61–1.24)
GFR calc Af Amer: 48 mL/min — ABNORMAL LOW (ref >60)
GFR calc non Af Amer: 41 mL/min — ABNORMAL LOW (ref >60)
GLUCOSE: 38 mg/dL — AB (ref 65–99)
POTASSIUM: 4.8 mmol/L (ref 3.5–5.1)
Sodium: 140 mmol/L (ref 135–145)
TOTAL PROTEIN: 7.7 g/dL (ref 6.5–8.1)

## 2015-02-28 LAB — LIPASE, BLOOD: Lipase: 151 U/L — ABNORMAL HIGH (ref 22–51)

## 2015-02-28 LAB — CBG MONITORING, ED
GLUCOSE-CAPILLARY: 25 mg/dL — AB (ref 65–99)
Glucose-Capillary: 153 mg/dL — ABNORMAL HIGH (ref 65–99)

## 2015-02-28 LAB — RAPID URINE DRUG SCREEN, HOSP PERFORMED
Amphetamines: NOT DETECTED
BENZODIAZEPINES: NOT DETECTED
Barbiturates: NOT DETECTED
COCAINE: POSITIVE — AB
OPIATES: NOT DETECTED
TETRAHYDROCANNABINOL: NOT DETECTED

## 2015-02-28 LAB — LACTIC ACID, PLASMA: Lactic Acid, Venous: 1.8 mmol/L (ref 0.5–2.0)

## 2015-02-28 LAB — I-STAT CG4 LACTIC ACID, ED: LACTIC ACID, VENOUS: 4.9 mmol/L — AB (ref 0.5–2.0)

## 2015-02-28 LAB — ETHANOL: Alcohol, Ethyl (B): 43 mg/dL — ABNORMAL HIGH (ref <5)

## 2015-02-28 LAB — I-STAT TROPONIN, ED: Troponin i, poc: 0.02 ng/mL (ref 0.00–0.08)

## 2015-02-28 MED ORDER — LORAZEPAM 2 MG/ML IJ SOLN
1.0000 mg | Freq: Once | INTRAMUSCULAR | Status: AC
  Administered 2015-02-28: 1 mg via INTRAVENOUS
  Filled 2015-02-28: qty 1

## 2015-02-28 MED ORDER — CETYLPYRIDINIUM CHLORIDE 0.05 % MT LIQD
7.0000 mL | Freq: Two times a day (BID) | OROMUCOSAL | Status: DC
  Administered 2015-03-01 – 2015-03-10 (×11): 7 mL via OROMUCOSAL

## 2015-02-28 MED ORDER — VITAMIN B-1 100 MG PO TABS
100.0000 mg | ORAL_TABLET | Freq: Every day | ORAL | Status: DC
  Administered 2015-03-01 – 2015-03-10 (×9): 100 mg via ORAL
  Filled 2015-02-28 (×10): qty 1

## 2015-02-28 MED ORDER — ONDANSETRON HCL 4 MG PO TABS: 4.0000 mg | ORAL_TABLET | Freq: Four times a day (QID) | ORAL | Status: DC | PRN

## 2015-02-28 MED ORDER — SODIUM CHLORIDE 0.9 % IJ SOLN
3.0000 mL | Freq: Two times a day (BID) | INTRAMUSCULAR | Status: DC
  Administered 2015-02-28 – 2015-03-06 (×5): 3 mL via INTRAVENOUS

## 2015-02-28 MED ORDER — ENOXAPARIN SODIUM 40 MG/0.4ML ~~LOC~~ SOLN
40.0000 mg | SUBCUTANEOUS | Status: DC
  Administered 2015-02-28: 40 mg via SUBCUTANEOUS
  Filled 2015-02-28: qty 0.4

## 2015-02-28 MED ORDER — DEXTROSE 50 % IV SOLN
50.0000 mL | Freq: Once | INTRAVENOUS | Status: AC
  Administered 2015-02-28: 50 mL via INTRAVENOUS

## 2015-02-28 MED ORDER — LORAZEPAM 2 MG/ML IJ SOLN
1.0000 mg | Freq: Four times a day (QID) | INTRAMUSCULAR | Status: AC | PRN
  Administered 2015-02-28 – 2015-03-03 (×6): 1 mg via INTRAVENOUS
  Filled 2015-02-28 (×6): qty 1

## 2015-02-28 MED ORDER — FOLIC ACID 1 MG PO TABS
1.0000 mg | ORAL_TABLET | Freq: Every day | ORAL | Status: DC
  Administered 2015-03-01 – 2015-03-10 (×9): 1 mg via ORAL
  Filled 2015-02-28 (×10): qty 1

## 2015-02-28 MED ORDER — MORPHINE SULFATE (PF) 4 MG/ML IV SOLN
4.0000 mg | Freq: Once | INTRAVENOUS | Status: AC
  Administered 2015-02-28: 4 mg via INTRAVENOUS
  Filled 2015-02-28: qty 1

## 2015-02-28 MED ORDER — IOHEXOL 300 MG/ML  SOLN
100.0000 mL | Freq: Once | INTRAMUSCULAR | Status: AC | PRN
  Administered 2015-02-28: 100 mL via INTRAVENOUS

## 2015-02-28 MED ORDER — SODIUM CHLORIDE 0.9 % IV BOLUS (SEPSIS)
1000.0000 mL | Freq: Once | INTRAVENOUS | Status: AC
  Administered 2015-02-28: 1000 mL via INTRAVENOUS

## 2015-02-28 MED ORDER — DEXTROSE 5 % IV SOLN
INTRAVENOUS | Status: DC
  Administered 2015-02-28: 20:00:00 via INTRAVENOUS

## 2015-02-28 MED ORDER — CHLORHEXIDINE GLUCONATE 0.12 % MT SOLN
15.0000 mL | Freq: Two times a day (BID) | OROMUCOSAL | Status: DC
  Administered 2015-02-28 – 2015-03-10 (×19): 15 mL via OROMUCOSAL
  Filled 2015-02-28 (×20): qty 15

## 2015-02-28 MED ORDER — THIAMINE HCL 100 MG/ML IJ SOLN
100.0000 mg | Freq: Every day | INTRAMUSCULAR | Status: DC
  Filled 2015-02-28 (×6): qty 1
  Filled 2015-02-28: qty 2
  Filled 2015-02-28: qty 1

## 2015-02-28 MED ORDER — CHLORHEXIDINE GLUCONATE 0.12 % MT SOLN
OROMUCOSAL | Status: AC
  Filled 2015-02-28: qty 15

## 2015-02-28 MED ORDER — ADULT MULTIVITAMIN W/MINERALS CH
1.0000 | ORAL_TABLET | Freq: Every day | ORAL | Status: DC
  Administered 2015-03-01 – 2015-03-10 (×9): 1 via ORAL
  Filled 2015-02-28 (×10): qty 1

## 2015-02-28 MED ORDER — ONDANSETRON HCL 4 MG/2ML IJ SOLN
4.0000 mg | Freq: Four times a day (QID) | INTRAMUSCULAR | Status: DC | PRN
  Administered 2015-03-01: 4 mg via INTRAVENOUS
  Filled 2015-02-28: qty 2

## 2015-02-28 MED ORDER — HYDROMORPHONE HCL 1 MG/ML IJ SOLN
0.5000 mg | INTRAMUSCULAR | Status: DC | PRN
  Administered 2015-03-01 – 2015-03-06 (×20): 0.5 mg via INTRAVENOUS
  Filled 2015-02-28 (×20): qty 1

## 2015-02-28 MED ORDER — SODIUM CHLORIDE 0.9 % IV SOLN
INTRAVENOUS | Status: DC
  Administered 2015-02-28 – 2015-03-02 (×6): via INTRAVENOUS
  Administered 2015-03-03: 100 mL/h via INTRAVENOUS
  Administered 2015-03-03 – 2015-03-05 (×2): via INTRAVENOUS

## 2015-02-28 MED ORDER — LORAZEPAM 1 MG PO TABS
1.0000 mg | ORAL_TABLET | Freq: Four times a day (QID) | ORAL | Status: AC | PRN
  Administered 2015-03-01 – 2015-03-02 (×4): 1 mg via ORAL
  Filled 2015-02-28 (×5): qty 1

## 2015-02-28 MED ORDER — HYDRALAZINE HCL 20 MG/ML IJ SOLN
10.0000 mg | Freq: Four times a day (QID) | INTRAMUSCULAR | Status: DC | PRN
  Administered 2015-02-28: 10 mg via INTRAVENOUS
  Filled 2015-02-28: qty 1

## 2015-02-28 MED ORDER — DOCUSATE SODIUM 100 MG PO CAPS
100.0000 mg | ORAL_CAPSULE | Freq: Two times a day (BID) | ORAL | Status: DC
  Administered 2015-02-28 – 2015-03-10 (×12): 100 mg via ORAL
  Filled 2015-02-28 (×19): qty 1

## 2015-02-28 MED ORDER — DEXTROSE 50 % IV SOLN
INTRAVENOUS | Status: AC
  Administered 2015-02-28: 50 mL via INTRAVENOUS
  Filled 2015-02-28: qty 50

## 2015-02-28 MED ORDER — ONDANSETRON HCL 4 MG/2ML IJ SOLN
4.0000 mg | Freq: Once | INTRAMUSCULAR | Status: AC
  Administered 2015-02-28: 4 mg via INTRAVENOUS
  Filled 2015-02-28: qty 2

## 2015-02-28 MED ORDER — THIAMINE HCL 100 MG/ML IJ SOLN
100.0000 mg | Freq: Every day | INTRAMUSCULAR | Status: DC
  Administered 2015-02-28: 100 mg via INTRAVENOUS
  Filled 2015-02-28: qty 2

## 2015-02-28 NOTE — ED Notes (Signed)
MD Darl Householder notified about elevated lactic acid

## 2015-02-28 NOTE — ED Provider Notes (Signed)
CSN: [id number]     Arrival date & time 02/28/15  1531 History   First MD Initiated Contact with Patient 02/28/15 1550     Chief Complaint  Patient presents with  . Abdominal Pain     (Consider location/radiation/quality/duration/timing/severity/associated sxs/prior Treatment) The history is provided by the patient.  Jeremy [name] is a 59 y.o. male kidney cancer status post removal here presenting with vomiting, weakness, falls. Patient drinks alcohol chronically and has been binge drinking last month or so. He drinks about a fifth of vodka daily. Last drink was yesterday. He also does cocaine occasionally. He states that he has been falling since yesterday and couldn't really remember how he falls. He also has been profusely vomiting since yesterday. Has trouble walking because he was weak. He also has diffuse abdominal pain.   Past Medical History  Diagnosis Date  . FH: kidney cancer    No past surgical history on file. No family history on file. Social History  Substance Use Topics  . Smoking status: Not on file  . Smokeless tobacco: Not on file  . Alcohol Use: 20.4 oz/week    24 Cans of beer, 10 Standard drinks or equivalent per week    Review of Systems  Gastrointestinal: Positive for vomiting and abdominal pain.  Neurological: Positive for weakness.  All other systems reviewed and are negative.     Allergies  Review of patient's allergies indicates no known allergies.  Home Medications   Prior to Admission medications   Not on File   BP 151/75 mmHg  Pulse 112  Temp(Src) 97.6 F (36.4 C) (Oral)  Resp 23  SpO2 100% Physical Exam  Constitutional: He is oriented to person, place, and time.  Chronically ill, dehydrated   HENT:  Head: Normocephalic.  MM dry   Eyes: Conjunctivae are normal. Pupils are equal, round, and reactive to light.  Neck: Normal range of motion. Neck supple.  Cardiovascular: Regular rhythm and normal heart sounds.   Tachycardic    Pulmonary/Chest: Effort normal and breath sounds normal. No respiratory distress. He has no wheezes. He has no rales.  Abdominal:  Firm, mild diffuse tenderness worse in epigastrium. + mild rebound   Musculoskeletal: Normal range of motion. He exhibits no edema or tenderness.  Neurological: He is alert and oriented to person, place, and time. No cranial nerve deficit. Coordination normal.  Skin: Skin is warm and dry.  Psychiatric: He has a normal mood and affect. His behavior is normal. Judgment and thought content normal.  Nursing note and vitals reviewed.   ED Course  Procedures (including critical care time)  CRITICAL CARE Performed by: Darl Householder, DAVID   Total critical care time: 30 min   Critical care time was exclusive of separately billable procedures and treating other patients.  Critical care was necessary to treat or prevent imminent or life-threatening deterioration.  Critical care was time spent personally by me on the following activities: development of treatment plan with patient and/or surrogate as well as nursing, discussions with consultants, evaluation of patient's response to treatment, examination of patient, obtaining history from patient or surrogate, ordering and performing treatments and interventions, ordering and review of laboratory studies, ordering and review of radiographic studies, pulse oximetry and re-evaluation of patient's condition.   Labs Review Labs Reviewed  CBC WITH DIFFERENTIAL/PLATELET - Abnormal; Notable for the following:    WBC 22.4 (*)    RBC 3.85 (*)    HCT 37.8 (*)    Platelets 104 (*)  Neutro Abs 19.3 (*)    Monocytes Absolute 1.3 (*)    All other components within normal limits  COMPREHENSIVE METABOLIC PANEL - Abnormal; Notable for the following:    Chloride 95 (*)    CO2 9 (*)    Glucose, Bld 38 (*)    BUN 23 (*)    Creatinine, Ser 1.71 (*)    Calcium 8.5 (*)    AST 2177 (*)    ALT 566 (*)    Alkaline Phosphatase 190 (*)     Total Bilirubin 1.3 (*)    GFR calc non Af Amer 41 (*)    GFR calc Af Amer 48 (*)    Anion gap 36 (*)    All other components within normal limits  LIPASE, BLOOD - Abnormal; Notable for the following:    Lipase 151 (*)    All other components within normal limits  ETHANOL - Abnormal; Notable for the following:    Alcohol, Ethyl (B) 43 (*)    All other components within normal limits  URINE RAPID DRUG SCREEN, HOSP PERFORMED - Abnormal; Notable for the following:    Cocaine POSITIVE (*)    All other components within normal limits  CBG MONITORING, ED - Abnormal; Notable for the following:    Glucose-Capillary 25 (*)    All other components within normal limits  CBG MONITORING, ED - Abnormal; Notable for the following:    Glucose-Capillary 153 (*)    All other components within normal limits  HEPATITIS PANEL, ACUTE  BASIC METABOLIC PANEL  I-STAT TROPOININ, ED  I-STAT CG4 LACTIC ACID, ED    Imaging Review Ct Head Wo Contrast  02/28/2015   CLINICAL DATA:  Blurred vision.  Alcohol use.  Inability to walk.  EXAM: CT HEAD WITHOUT CONTRAST  CT CERVICAL SPINE WITHOUT CONTRAST  TECHNIQUE: Multidetector CT imaging of the head and cervical spine was performed following the standard protocol without intravenous contrast. Multiplanar CT image reconstructions of the cervical spine were also generated.  COMPARISON:  CT head and cervical spine 06/28/2007.  FINDINGS: CT HEAD FINDINGS  Premature for age cerebral and cerebellar atrophy. No evidence for acute infarction, hemorrhage, mass lesion, hydrocephalus, or extra-axial fluid.  Calvarium intact. No sinus or mastoid air fluid level. Cerebral volume loss since prior study of 2009.  CT CERVICAL SPINE FINDINGS  There is no visible cervical spine fracture, traumatic subluxation, prevertebral soft tissue swelling, or intraspinal hematoma. Reversal of the normal cervical lordotic curve nuchal ligament calcification. Disc space narrowing at C5-6 and C3-4, with  widespread cervical facet disease. No neck masses. Minor atheromatous change at the carotid bifurcations. No visible pneumothorax. Staples seen in the LEFT lung apex.  IMPRESSION: Premature for age atrophy.  No acute intracranial findings.  Cervical spondylosis without fracture or traumatic subluxation.   Electronically Signed   By: Staci Righter M.D.   On: 02/28/2015 16:31   Ct Cervical Spine Wo Contrast  02/28/2015   CLINICAL DATA:  Blurred vision.  Alcohol use.  Inability to walk.  EXAM: CT HEAD WITHOUT CONTRAST  CT CERVICAL SPINE WITHOUT CONTRAST  TECHNIQUE: Multidetector CT imaging of the head and cervical spine was performed following the standard protocol without intravenous contrast. Multiplanar CT image reconstructions of the cervical spine were also generated.  COMPARISON:  CT head and cervical spine 06/28/2007.  FINDINGS: CT HEAD FINDINGS  Premature for age cerebral and cerebellar atrophy. No evidence for acute infarction, hemorrhage, mass lesion, hydrocephalus, or extra-axial fluid.  Calvarium intact. No sinus  or mastoid air fluid level. Cerebral volume loss since prior study of 2009.  CT CERVICAL SPINE FINDINGS  There is no visible cervical spine fracture, traumatic subluxation, prevertebral soft tissue swelling, or intraspinal hematoma. Reversal of the normal cervical lordotic curve nuchal ligament calcification. Disc space narrowing at C5-6 and C3-4, with widespread cervical facet disease. No neck masses. Minor atheromatous change at the carotid bifurcations. No visible pneumothorax. Staples seen in the LEFT lung apex.  IMPRESSION: Premature for age atrophy.  No acute intracranial findings.  Cervical spondylosis without fracture or traumatic subluxation.   Electronically Signed   By: Staci Righter M.D.   On: 02/28/2015 16:31   Ct Abdomen Pelvis W Contrast  02/28/2015   CLINICAL DATA:  Abdominal pain, vomiting and generalized weakness. Alcohol intoxication. Fall.  EXAM: CT ABDOMEN AND PELVIS WITH  CONTRAST  TECHNIQUE: Multidetector CT imaging of the abdomen and pelvis was performed using the standard protocol following bolus administration of intravenous contrast.  CONTRAST:  138mL OMNIPAQUE IOHEXOL 300 MG/ML  SOLN  COMPARISON:  12/22/2007 CT abdomen/pelvis.  FINDINGS: Lower chest: No significant pulmonary nodules or acute consolidative airspace disease.  Hepatobiliary: Diffuse hepatic steatosis. No liver mass. Distended gallbladder containing layering calcified gallstones measuring up to the 5 mm. Top-normal gallbladder wall thickness. No biliary ductal dilatation.  Pancreas: There is prominent fat stranding and ill-defined fluid surrounding the entire length of the mildly thickened pancreas, in keeping with acute pancreatitis. No pancreatic parenchymal calcifications. No pancreatic mass. Top-normal caliber pancreatic duct. No focal measurable pancreatic or peripancreatic fluid collections. No areas of pancreatic parenchymal nonenhancement or gas.  Spleen: Normal size. No mass.  Adrenals/Urinary Tract: Normal adrenals. Status post left nephrectomy, with no mass or fluid collection in the left nephrectomy bed. Normal right kidney, with no right hydronephrosis and no right renal mass. Normal bladder.  Stomach/Bowel: Grossly normal stomach. Normal caliber small bowel with no small bowel wall thickening. Appendix is not discretely visualized. Normal large bowel with no diverticulosis, large bowel wall thickening or pericolonic fat stranding.  Vascular/Lymphatic: Atherosclerotic nonaneurysmal abdominal aorta. Patent portal, splenic, hepatic and right renal veins. No pathologically enlarged lymph nodes in the abdomen or pelvis.  Reproductive: Mild prostatomegaly with nonspecific internal prostatic calcification.  Other: No pneumoperitoneum, ascites or focal fluid collection.  Musculoskeletal: No aggressive appearing focal osseous lesions.  IMPRESSION: 1. Acute uncomplicated pancreatitis. 2. Cholelithiasis.  Distended gallbladder. No specific findings to suggest acute cholecystitis. 3. No biliary ductal dilatation. 4. Diffuse hepatic steatosis. 5. Status post left nephrectomy, with no abnormal findings in the left nephrectomy bed.   Electronically Signed   By: Ilona Sorrel M.D.   On: 02/28/2015 19:37   Dg Abd Acute W/chest  02/28/2015   CLINICAL DATA:  Ethanol abuse. Abdominal pain. Generalized weakness.  EXAM: DG ABDOMEN ACUTE W/ 1V CHEST  COMPARISON:  Radiograph 04/30/2008  FINDINGS: Normal cardiac silhouette with ectatic aorta. There is postsurgical change in the LEFT upper lobe along the mediastinum. No effusion, infiltrate, or pneumothorax.  LEFT lower decubitus view demonstrates no intraperitoneal free air. AP view demonstrates no dilated loops of large or small bowel. There is gas the rectum. No pathologic calcifications. No organomegaly.  IMPRESSION: 1.  No acute cardiopulmonary process. 2. No evidence of bowel obstruction or intraperitoneal free air.   Electronically Signed   By: Suzy Bouchard M.D.   On: 02/28/2015 16:45   I have personally reviewed and evaluated these images and lab results as part of my medical decision-making.   EKG Interpretation  None      MDM   Final diagnoses:  None   Jeremy [name] is a 59 y.o. male here with vomiting, falls. Likely alcoholic pancreatitis vs gastritis vs hepatitis vs perforation. Syncope likely from dehydration vs alcohol use. Will get CT head/neck, labs, acute abdominal series. Will likely to CT ab/pel as well.   8:00 PM WBC 22. Bicarb 9, has acute renal failure and AST 2000, ALT 500. Lipase elevated. I think likely alcohol hepatitis and pancreatitis. Has elevated AG likely from dehydration and hepatitis. He denies other ingestions other than alcohol. Given 2 L NS and pain meds. Still in pain and still tachy. CT showed pancreatitis. Will admit to stepdown.    Wandra Arthurs, MD 02/28/15 2004

## 2015-02-28 NOTE — ED Notes (Signed)
Patient transported to CT 

## 2015-02-28 NOTE — H&P (Signed)
History and Physical  Jeremy [name]  ACZ:[id number]  DOB: 1955/07/03  DOA: 02/28/2015  Referring physician: Dr. Darl Householder PCP: No primary care provider on file.   Chief Complaint: "I've been throwing up and I keep falling, and my stomach hurts."  HPI: Jeremy [name] is a 59 y.o. male with a past medical history significant for RCC s/p L nephrectomy in 2009, homelessness, and alcoholism who presents with 3 days abdominal pain, vomiting, and falling.  The patient reports living on the streets and drinking 2 fifths of spirits daily for the last month. In the last few days he's become weak and had repetitive falls. He is vomiting and has severe abdominal pain. He is mildly confused. He fell out in the street today and and balance was called.  In the ED, the patient was tachycardic and in severe pain and tremors. He had acute hepatitis, acute kidney injury, elevated anion gap metabolic acidosis, glucose of 25 mg/dL, lactic acidosis greater than 4 mmol/L, leukocytosis, and alcohol level of 43 mg/dL, normal head CT, and a CT of the abdomen and pelvis showed pancreatitis. He thinks his last drink was one day ago. He was admitted to stepdown by [facility] for acute alcoholic pancreatitis, acute hepatitis, and acute kidney injury.   Review of Systems:  Patient seen 8:07 PM on 02/28/2015. Pt complains of abdominal pain, emesis, tremor, confusion, weakness, falls. Pt denies any fever, dyspnea, cough.  Twelve systems were reviewed and were negative except as just noted or noted in the history of present illness.  Past Medical History  Diagnosis Date  . FH: kidney cancer   . Cancer 2006    kidney cancer  . Chronic kidney disease     RCC, s/p resection in 2009  The above past medical history was reviewed. Ruptured peptic ulcer. Denies history of stroke, heart attack, congestive heart failure, diabetes or hypertension.  Past Surgical History  Procedure Laterality Date  . Nephrectomy    . Left lobe of lung removed   2006  . Ulcer surgery  2008  The above surgical history was reviewed.  Social History: Patient lives on the street. He is from [place] originally. He has no family in [place]. He reports drinking about a half gallon of spirits per day for the last month. He denies illicit drug use.  No Known Allergies  Family History  Problem Relation Age of Onset  . Diabetes Father   . Alcohol abuse Other     "many people'    Prior to Admission medications   Not on File  None  Physical Exam: BP 174/94 mmHg  Pulse 122  Temp(Src) 97.6 F (36.4 C) (Oral)  Resp 20  SpO2 99% General: Adult male, in distress from pain and nausea.  Responds to questions.  Oriented to situation but unsure what city he is in, or what hospital this is. Vomits periodically. HEENT: Head normal.  Scleral icterus.  PERRL and EOMI.  Nose normal.  OP red, dry. No cobblestoning, or ulcers.  No airway deformities.  Neck supple. No thyromegaly.  Lymph: No cervical or axillary lymphadenopathy. Skin: Warm and moist.  No suspicious rashes or lesions. Cardiac: Tachycardic regular, nl S1-S2, no murmurs appreciated.  Capillary refill is less than 2 seconds.  No LE edema.  Respiratory: Normal respiratory rate and rhythm.  CTAB without rales or wheezes. Abdomen: BS present.  Marked TTP and guarding.  No ascites, distension. Neuro: Sensorium intact to questions.  Not aware of what city this is, knows it is  a hospital.  Knows he feels "bad". Speech is fluent. Large coarse tremor.  No pronator drift or asterixis.  Muscle bulk normal.  5/5 grip strength and elbow and wrist flexion/extension, symmetrically.  5/5 hip flexion symmetrically.  Appears to move all extremities equally.    Psych: Appropriate affect, but in pain.  No evidence of aural or visual hallucinations or delusions. Attention and concentration are normal.           Labs on Admission:  The metabolic panel is notable for bicarbonate 9 mg/dL, potassium 4.8, creatinine 1.71  from a previous baseline in 2000 91.169 g/dL. Anion gap E to 36 Glucose 1 53 mg/dL from a previous of 25 mg/dL on admission. AST 2177 U/L and ALT 566 U/L. Initial troponin negative. Alcohol level 43 mg/dL. Lactic acid 4.1 mmol/L.  The complete blood count is notable for no evidence of anemia. Leukocytosis 22.4K/uL.  Radiological Exams on Admission: Personally reviewed: Dg Abd Acute W/chest 02/28/2015  No acute lung opacities or abdominal free air.  Ct Head and C-spine Wo Contrast 02/28/2015    IMPRESSION: Premature for age atrophy.  No acute intracranial findings.  Cervical spondylosis without fracture or traumatic subluxation.    Ct Abdomen Pelvis W Contrast 02/28/2015  IMPRESSION:  1. Acute uncomplicated pancreatitis.  2. Cholelithiasis. Distended gallbladder. No specific findings to suggest acute cholecystitis.  3. No biliary ductal dilatation.  4. Diffuse hepatic steatosis.  5. Status post left nephrectomy, with no abnormal findings in the left nephrectomy bed.       EKG: Independently reviewed. Sinus taachycardia.  Assessment/Plan Principal Problem:   Pancreatitis Active Problems:   Acute alcoholic hepatitis   Acute kidney injury   Alcohol abuse   Metabolic acidosis   Leukocytosis   Ketoacidosis   Lactic acidosis    1. Pancreatitis, alcoholic:  Alcoholic pancreatitis based on history. No findings of common bile duct dilation on CT. The case was discussed with critical care E-Link MD, who agreed with plan of care. -Normal saline at 250 mL per hour -Nothing by mouth -Hydromorphone 0.5 mg every 2 hours when necessary -Stepdown in telemetry -Repeat lactic acid, electrolytes at midnight  2. Hepatitis, acute, alcoholic:  Presumably alcoholic based on history, AST-ALT ratio. INR pending. -Trend LFT -Prednisolone based on INR  3. Anion gap metabolic acidosis:  The patient has an elevated lactic acid level and presumably starvation/alcoholic ketoacidosis. He  denies other ingestions. -Serum osmolality -Repeat lactic acid and electrolytes -IV fluids  4. AKI:  Unclear etiology, likely hypovolemic. -Urine electrolytes -IV fluids  5. Alcohol dependence and withdrawal:  -CIWA protocol with lorazepam -Thiamine and vitamin supplementation  6. Hypertension: This is new. -Hydralazine 10 mg when necessary for elevated blood pressure  7. Leukocytosis: Suspect related to pancreatitis. -Monitor for fever -Trend CBC  8. Hypoglycemia: Resolved. -Bedside CBG     DVT PPx: Lovenox Diet: NPO Code Status: Full  Disposition Plan:  The appropriate admission status for this patient is INPATIENT. Inpatient status is judged to be reasonable and necessary in order to provide the required intensity of service to ensure the patient's safety. The patient's presenting symptoms, physical exam findings, and initial radiographic and laboratory data in the context of their chronic comorbidities is felt to place them at high risk for further clinical deterioration. Furthermore, it is not anticipated that the patient will be medically stable for discharge from the hospital within 2 midnights of admission. The following factors support the admission status of inpatient.   A. The patient's presenting  symptoms include abdominal pain, vomiting, weakness. B. The worrisome physical exam findings include tachycardia, confusion. C. The initial radiographic and laboratory data are worrisome because of elevated lactic acid, transaminitis, leukocytosis, elevated creatinine, acidosis, pancreatitis on CT imaging. D. The chronic co-morbidities include alcoholism and homelessness. E. Patient requires inpatient status due to high intensity of service, high risk for further deterioration and high frequency of surveillance required. F. I certify that at the point of admission it is my clinical judgment that the patient will require inpatient hospital care spanning beyond 2 midnights  from the point of admission.     Edwin Dada [facility] Pager [phone]

## 2015-02-28 NOTE — ED Notes (Signed)
Pt comes to ED via EMS. Pt c/o abdominal pain, vomiting, and general weakness. Pt says he drank too much alcohol. Pt estimates he drank about two full fifths/ and a case of beer. Pt says he has been drinking for 15 years regularly. Pt states he blurred vision and can"t walk. Pt also states he fallen down a number of times.

## 2015-02-28 NOTE — ED Notes (Signed)
Below order not completed by EW. 

## 2015-02-28 NOTE — Progress Notes (Signed)
When asked pt if he is having thoughts of harming himself - he states yes. When asked if he is having thoughts of harming someone else he states no. Talbert Cage RN notified.  Lucius Conn BSN, RN-BC Admissions RN  TDoroteo Bradford BSN, RN-BC Admissions RN  02/28/2015 8:09 PM

## 2015-02-28 NOTE — ED Notes (Signed)
Delay with lab draw, pt not in room 

## 2015-02-28 NOTE — ED Notes (Signed)
Patient gone to CT, will complete ekg when patient returns

## 2015-03-01 ENCOUNTER — Inpatient Hospital Stay (HOSPITAL_COMMUNITY): Payer: Self-pay

## 2015-03-01 DIAGNOSIS — R7989 Other specified abnormal findings of blood chemistry: Secondary | ICD-10-CM

## 2015-03-01 DIAGNOSIS — R945 Abnormal results of liver function studies: Secondary | ICD-10-CM

## 2015-03-01 LAB — SODIUM, URINE, RANDOM: Sodium, Ur: 59 mmol/L

## 2015-03-01 LAB — C DIFFICILE QUICK SCREEN W PCR REFLEX
C DIFFICILE (CDIFF) TOXIN: NEGATIVE
C DIFFICLE (CDIFF) ANTIGEN: POSITIVE — AB

## 2015-03-01 LAB — LACTIC ACID, PLASMA: LACTIC ACID, VENOUS: 1.2 mmol/L (ref 0.5–2.0)

## 2015-03-01 LAB — COMPREHENSIVE METABOLIC PANEL
ALBUMIN: 3.1 g/dL — AB (ref 3.5–5.0)
ALK PHOS: 174 U/L — AB (ref 38–126)
ALT: 572 U/L — AB (ref 17–63)
ANION GAP: 16 — AB (ref 5–15)
AST: 3630 U/L — AB (ref 15–41)
BILIRUBIN TOTAL: 2.2 mg/dL — AB (ref 0.3–1.2)
BUN: 17 mg/dL (ref 6–20)
CO2: 17 mmol/L — AB (ref 22–32)
CREATININE: 1.38 mg/dL — AB (ref 0.61–1.24)
Calcium: 7 mg/dL — ABNORMAL LOW (ref 8.9–10.3)
Chloride: 101 mmol/L (ref 101–111)
GFR calc Af Amer: >60 mL/min (ref >60)
GFR calc non Af Amer: 54 mL/min — ABNORMAL LOW (ref >60)
Glucose, Bld: 106 mg/dL — ABNORMAL HIGH (ref 65–99)
Potassium: 4.5 mmol/L (ref 3.5–5.1)
SODIUM: 134 mmol/L — AB (ref 135–145)
TOTAL PROTEIN: 5.5 g/dL — AB (ref 6.5–8.1)

## 2015-03-01 LAB — CBC
HEMATOCRIT: 31.6 % — AB (ref 39.0–52.0)
Hemoglobin: 11.2 g/dL — ABNORMAL LOW (ref 13.0–17.0)
MCH: 33.7 pg (ref 26.0–34.0)
MCHC: 35.4 g/dL (ref 30.0–36.0)
MCV: 95.2 fL (ref 78.0–100.0)
PLATELETS: 87 10*3/uL — AB (ref 150–400)
RBC: 3.32 MIL/uL — AB (ref 4.22–5.81)
RDW: 14.5 % (ref 11.5–15.5)
WBC: 10.8 10*3/uL — AB (ref 4.0–10.5)

## 2015-03-01 LAB — HEPATITIS PANEL, ACUTE
HCV Ab: <0.1 s/co ratio (ref 0.0–0.9)
HEP B C IGM: NEGATIVE
Hep A IgM: NEGATIVE
Hepatitis B Surface Ag: NEGATIVE

## 2015-03-01 LAB — MRSA PCR SCREENING: MRSA by PCR: NEGATIVE

## 2015-03-01 LAB — CREATININE, URINE, RANDOM: CREATININE, URINE: 38.72 mg/dL

## 2015-03-01 LAB — GLUCOSE, CAPILLARY: Glucose-Capillary: 128 mg/dL — ABNORMAL HIGH (ref 65–99)

## 2015-03-01 LAB — ACETAMINOPHEN LEVEL: Acetaminophen (Tylenol), Serum: <10 ug/mL — ABNORMAL LOW (ref 10–30)

## 2015-03-01 LAB — OSMOLALITY: Osmolality: 298 mOsm/kg (ref 275–300)

## 2015-03-01 LAB — PROTIME-INR
INR: 1.34 (ref 0.00–1.49)
PROTHROMBIN TIME: 16.7 s — AB (ref 11.6–15.2)

## 2015-03-01 MED ORDER — PANTOPRAZOLE SODIUM 40 MG IV SOLR: 40.0000 mg | Freq: Two times a day (BID) | INTRAVENOUS | Status: DC

## 2015-03-01 MED ORDER — SODIUM CHLORIDE 0.9 % IV SOLN: 80.0000 mg | Freq: Once | INTRAVENOUS | Status: DC

## 2015-03-01 MED ORDER — CLONIDINE HCL 0.1 MG PO TABS
0.1000 mg | ORAL_TABLET | Freq: Two times a day (BID) | ORAL | Status: DC
  Administered 2015-03-01 – 2015-03-10 (×18): 0.1 mg via ORAL
  Filled 2015-03-01 (×20): qty 1

## 2015-03-01 MED ORDER — SODIUM CHLORIDE 0.9 % IV SOLN
8.0000 mg/h | INTRAVENOUS | Status: AC
  Administered 2015-03-01 – 2015-03-02 (×3): 8 mg/h via INTRAVENOUS
  Filled 2015-03-01 (×11): qty 80

## 2015-03-01 NOTE — Progress Notes (Signed)
[facility] PROGRESS NOTE  Jeremy [name] ZOX:[id number] DOB: Jan 03, 1956 DOA: 02/28/2015 PCP: No primary care provider on file.  Assessment/Plan:  1.  Liver failure -Jeremy [name] presents with a history of daily heavy alcohol use, having severe abdominal pain associate with nausea and vomiting that would be consistent with alcoholic hepatitis.  -Labs indicate a MELD score of 16 with a Hepatic Discriminate Function of 23.8. I don't think he would benefit from systemic steroids. -Although alcoholic hepatitis is on the differential, his liver function tests revealed an AST of 3630 with ALT of 572. I think the degree of AST/ALT elevation would seem unusual for acute alcoholic hepatitis, and I'm concerned for other mechanisms of liver injury. -Possibilities include viral hepatitis, ischemia related to cocaine abuse, Tylenol overdose will check hepatitis B, hepatitis C, HIV, right upper quadrant ultrasound. -Case was discussed with GI who will consult.  2.  Alcohol abuse with alcohol withdrawals  -Patient reporting drinking 2/5 of liquor on a daily basis -This morning he had clinical signs symptoms consistent with acute withdrawal, he is at high risk for developing delirium tremens and alcohol withdrawal seizures -Will continue management with IV Ativan, thiamine and folate, IV fluids, supportive care, close monitoring as stepdown unit.  3.  Acute kidney injury. -I secondary to profound dehydration as he presented with a creatinine of 1.7 with BUN of 23. Labs showing improvement this morning with demonstration of IV fluids, with downward trend in creatinine to 1.38 and BUN of 17 -Into the IV fluid resuscitation  4.  Anion gap metabolic acidosis -Initial labs revealed a bicarbonate of 9 with anion gap of 36 -This is likely related to alcoholic ketoacidosis characterized by starvation -A.m. labs showing closing of his gap of to 16 with bicarbonate of 17.  5.  Probable alcoholic pancreatitis -He  presented with significant abdominal pain having a lipase level of 151 -Continue nothing by mouth status, IV fluid resuscitation, as needed narcotic analgesia for pain management.  6.  DVT prophylaxis -SCDs  Code Status: Full code Family Communication: Family not present Disposition Plan: Will continue close monitoring in the step down unit   Consultants:  GI  HPI/Subjective: Jeremy [name] is a 59 year old male with a past medical history renal cell carcinoma status post left nephrectomy in 2009, chronic alcoholism, homelessness, admitted to the medicine service on 02/28/2015 when he presented to the emergency department with complaints of abdominal pain associate with nausea/vomiting and generalized weakness. He reports drinking 2/5 of liquor on a daily basis. On admission lab work revealed an AST of 2177 with ALT of 566, bicarbonate of 9 with creatinine of 1.7. He was admitted to the stepdown units and started on IV fluids. He has required Ativan for signs symptoms of alcohol withdrawal. Based on a.m. lab work he had a meld score of 16 with a hepatic  Objective: Filed Vitals:   03/01/15 0600  BP: 132/80  Pulse: 111  Temp:   Resp: 12    Intake/Output Summary (Last 24 hours) at 03/01/15 0818 Last data filed at 03/01/15 0600  Gross per 24 hour  Intake 2172.5 ml  Output      0 ml  Net 2172.5 ml   There were no vitals filed for this visit.  Exam:   General:  Disheveled, chronically ill-appearing, mildly confused, tremorous  Cardiovascular: Regular rate and rhythm normal S1-S2 no murmurs or gallops  Respiratory: Normal respiratory effort, lungs overall clear to auscultation bilaterally  Abdomen: Patient having significant pain with palpation across abdomen,  particularly over epigastric region.  Musculoskeletal: Sarcopenia  Data Reviewed: Basic Metabolic Panel:  Recent Labs Lab 02/28/15 1700 02/28/15 1955 03/01/15 0110  NA 140 135 134*  K 4.8 5.3* 4.5  CL 95* 99*  101  CO2 9* 13* 17*  GLUCOSE 38* 111* 106*  BUN 23* 22* 17  CREATININE 1.71* 1.54* 1.38*  CALCIUM 8.5* 7.4* 7.0*   Liver Function Tests:  Recent Labs Lab 02/28/15 1700 03/01/15 0110  AST 2177* 3630*  ALT 566* 572*  ALKPHOS 190* 174*  BILITOT 1.3* 2.2*  PROT 7.7 5.5*  ALBUMIN 4.2 3.1*    Recent Labs Lab 02/28/15 1700  LIPASE 151*   No results for input(s): AMMONIA in the last 168 hours. CBC:  Recent Labs Lab 02/28/15 1700 03/01/15 0110  WBC 22.4* 10.8*  NEUTROABS 19.3*  --   HGB 13.0 11.2*  HCT 37.8* 31.6*  MCV 98.2 95.2  PLT 104* 87*   Cardiac Enzymes: No results for input(s): CKTOTAL, CKMB, CKMBINDEX, TROPONINI in the last 168 hours. BNP (last 3 results) No results for input(s): BNP in the last 8760 hours.  ProBNP (last 3 results) No results for input(s): PROBNP in the last 8760 hours.  CBG:  Recent Labs Lab 02/28/15 1805 02/28/15 1835 02/28/15 2142  GLUCAP 25* 153* 144*    Recent Results (from the past 240 hour(s))  MRSA PCR Screening     Status: None   Collection Time: 02/28/15 11:31 PM  Result Value Ref Range Status   MRSA by PCR NEGATIVE NEGATIVE Final    Comment:        The GeneXpert MRSA Assay (FDA approved for NASAL specimens only), is one component of a comprehensive MRSA colonization surveillance program. It is not intended to diagnose MRSA infection nor to guide or monitor treatment for MRSA infections.      Studies: Ct Head Wo Contrast  02/28/2015   CLINICAL DATA:  Blurred vision.  Alcohol use.  Inability to walk.  EXAM: CT HEAD WITHOUT CONTRAST  CT CERVICAL SPINE WITHOUT CONTRAST  TECHNIQUE: Multidetector CT imaging of the head and cervical spine was performed following the standard protocol without intravenous contrast. Multiplanar CT image reconstructions of the cervical spine were also generated.  COMPARISON:  CT head and cervical spine 06/28/2007.  FINDINGS: CT HEAD FINDINGS  Premature for age cerebral and cerebellar  atrophy. No evidence for acute infarction, hemorrhage, mass lesion, hydrocephalus, or extra-axial fluid.  Calvarium intact. No sinus or mastoid air fluid level. Cerebral volume loss since prior study of 2009.  CT CERVICAL SPINE FINDINGS  There is no visible cervical spine fracture, traumatic subluxation, prevertebral soft tissue swelling, or intraspinal hematoma. Reversal of the normal cervical lordotic curve nuchal ligament calcification. Disc space narrowing at C5-6 and C3-4, with widespread cervical facet disease. No neck masses. Minor atheromatous change at the carotid bifurcations. No visible pneumothorax. Staples seen in the LEFT lung apex.  IMPRESSION: Premature for age atrophy.  No acute intracranial findings.  Cervical spondylosis without fracture or traumatic subluxation.   Electronically Signed   By: Staci Righter M.D.   On: 02/28/2015 16:31   Ct Cervical Spine Wo Contrast  02/28/2015   CLINICAL DATA:  Blurred vision.  Alcohol use.  Inability to walk.  EXAM: CT HEAD WITHOUT CONTRAST  CT CERVICAL SPINE WITHOUT CONTRAST  TECHNIQUE: Multidetector CT imaging of the head and cervical spine was performed following the standard protocol without intravenous contrast. Multiplanar CT image reconstructions of the cervical spine were also generated.  COMPARISON:  CT head and cervical spine 06/28/2007.  FINDINGS: CT HEAD FINDINGS  Premature for age cerebral and cerebellar atrophy. No evidence for acute infarction, hemorrhage, mass lesion, hydrocephalus, or extra-axial fluid.  Calvarium intact. No sinus or mastoid air fluid level. Cerebral volume loss since prior study of 2009.  CT CERVICAL SPINE FINDINGS  There is no visible cervical spine fracture, traumatic subluxation, prevertebral soft tissue swelling, or intraspinal hematoma. Reversal of the normal cervical lordotic curve nuchal ligament calcification. Disc space narrowing at C5-6 and C3-4, with widespread cervical facet disease. No neck masses. Minor  atheromatous change at the carotid bifurcations. No visible pneumothorax. Staples seen in the LEFT lung apex.  IMPRESSION: Premature for age atrophy.  No acute intracranial findings.  Cervical spondylosis without fracture or traumatic subluxation.   Electronically Signed   By: Staci Righter M.D.   On: 02/28/2015 16:31   Ct Abdomen Pelvis W Contrast  02/28/2015   CLINICAL DATA:  Abdominal pain, vomiting and generalized weakness. Alcohol intoxication. Fall.  EXAM: CT ABDOMEN AND PELVIS WITH CONTRAST  TECHNIQUE: Multidetector CT imaging of the abdomen and pelvis was performed using the standard protocol following bolus administration of intravenous contrast.  CONTRAST:  186mL OMNIPAQUE IOHEXOL 300 MG/ML  SOLN  COMPARISON:  12/22/2007 CT abdomen/pelvis.  FINDINGS: Lower chest: No significant pulmonary nodules or acute consolidative airspace disease.  Hepatobiliary: Diffuse hepatic steatosis. No liver mass. Distended gallbladder containing layering calcified gallstones measuring up to the 5 mm. Top-normal gallbladder wall thickness. No biliary ductal dilatation.  Pancreas: There is prominent fat stranding and ill-defined fluid surrounding the entire length of the mildly thickened pancreas, in keeping with acute pancreatitis. No pancreatic parenchymal calcifications. No pancreatic mass. Top-normal caliber pancreatic duct. No focal measurable pancreatic or peripancreatic fluid collections. No areas of pancreatic parenchymal nonenhancement or gas.  Spleen: Normal size. No mass.  Adrenals/Urinary Tract: Normal adrenals. Status post left nephrectomy, with no mass or fluid collection in the left nephrectomy bed. Normal right kidney, with no right hydronephrosis and no right renal mass. Normal bladder.  Stomach/Bowel: Grossly normal stomach. Normal caliber small bowel with no small bowel wall thickening. Appendix is not discretely visualized. Normal large bowel with no diverticulosis, large bowel wall thickening or  pericolonic fat stranding.  Vascular/Lymphatic: Atherosclerotic nonaneurysmal abdominal aorta. Patent portal, splenic, hepatic and right renal veins. No pathologically enlarged lymph nodes in the abdomen or pelvis.  Reproductive: Mild prostatomegaly with nonspecific internal prostatic calcification.  Other: No pneumoperitoneum, ascites or focal fluid collection.  Musculoskeletal: No aggressive appearing focal osseous lesions.  IMPRESSION: 1. Acute uncomplicated pancreatitis. 2. Cholelithiasis. Distended gallbladder. No specific findings to suggest acute cholecystitis. 3. No biliary ductal dilatation. 4. Diffuse hepatic steatosis. 5. Status post left nephrectomy, with no abnormal findings in the left nephrectomy bed.   Electronically Signed   By: Ilona Sorrel M.D.   On: 02/28/2015 19:37   Dg Abd Acute W/chest  02/28/2015   CLINICAL DATA:  Ethanol abuse. Abdominal pain. Generalized weakness.  EXAM: DG ABDOMEN ACUTE W/ 1V CHEST  COMPARISON:  Radiograph 04/30/2008  FINDINGS: Normal cardiac silhouette with ectatic aorta. There is postsurgical change in the LEFT upper lobe along the mediastinum. No effusion, infiltrate, or pneumothorax.  LEFT lower decubitus view demonstrates no intraperitoneal free air. AP view demonstrates no dilated loops of large or small bowel. There is gas the rectum. No pathologic calcifications. No organomegaly.  IMPRESSION: 1.  No acute cardiopulmonary process. 2. No evidence of bowel obstruction or intraperitoneal free air.   Electronically  Signed   By: Suzy Bouchard M.D.   On: 02/28/2015 16:45    Scheduled Meds: . antiseptic oral rinse  7 mL Mouth Rinse q12n4p  . chlorhexidine  15 mL Mouth Rinse BID  . docusate sodium  100 mg Oral BID  . enoxaparin (LOVENOX) injection  40 mg Subcutaneous Q24H  . folic acid  1 mg Oral Daily  . multivitamin with minerals  1 tablet Oral Daily  . sodium chloride  3 mL Intravenous Q12H  . thiamine  100 mg Oral Daily   Or  . thiamine  100 mg  Intravenous Daily   Continuous Infusions: . sodium chloride 250 mL/hr at 03/01/15 1829    Principal Problem:   Pancreatitis Active Problems:   Acute alcoholic hepatitis   Acute kidney injury   Alcohol abuse   Metabolic acidosis   Leukocytosis   Ketoacidosis   Lactic acidosis    Time spent: 40 min    Kelvin Cellar  [facility] Pager [phone]. If 7PM-7AM, please contact night-coverage at [email or web address], password [facility] 03/01/2015, 8:18 AM  LOS: 1 day

## 2015-03-01 NOTE — Consult Note (Signed)
Referring Provider:  [facility] Primary Care Physician:  No primary care provider on file. Primary Gastroenterologist:  unassigned  Reason for Consultation:  Abnormal LFTs, pancreatitis, hematemesis  HPI: Jeremy [name] is a 59 y.o. male admitted to the emergency room last night with abdominal pain, nausea, and vomiting. Patient has a history of renal cell carcinoma and is status post left nephrectomy in 2009. Unfortunately, he is homeless and has been living on the streets. He has been drinking a fifth of alcohol daily for a long time, but over the past month has been drinking two fifths daily. He has been experiencing some mild epigastric discomfort that comes and goes. In the last few weeks he felt very weak and has had several episodes of falling. Over the past 1-2 weeks he has developed severe epigastric abdominal pain. He states that yesterday he vomited blood. He fell in the street and EMS was called. In the emergency room he was noted to be complaining of severe pain and was very tremorous. He was quite tachycardic. Laboratory studies revealed a lipase of 151, total bili 1.3, alkaline phosphatase 190, ALT 566, and AST 2177. CT revealed the pancreas with prominent fat stranding and ill-defined fluid surrounding the entire length of the mildly thickened pancreas. No pancreatic parenchymal calcifications. No pancreatic mass. Top normal caliber pancreatic duct. No focal measurable pancreatic or. Pancreatic fluid collections. No areas of pancreatic parenchymal non-enhancement or gas. There was diffuse hepatic steatosis. No liver mass. Distended gallbladder containing layered calcified gallstones measuring up to 5 mm. Top normal gallbladder wall thickness. No biliary ductal dilatation. The stomach appeared grossly normal normal caliber small bowel with no small bowel wall thickening. Appendix is not discretely visualized. Normal large bowel with no diverticulosis, large bowel wall thickening, or  pericolonic fat stranding. Patient also reports that he has been using cocaine intermittently. He denies a prior history of hepatitis. He has never had a colonoscopy. He denies a history of gastritis or ulcers. He denies use of non-steroidal anti-inflammatory medications.   Past Medical History  Diagnosis Date  . FH: kidney cancer   . Cancer 2006    kidney cancer  . Chronic kidney disease     RCC, s/p resection in 2009    Past Surgical History  Procedure Laterality Date  . Nephrectomy    . Left lobe of lung removed  2006  . Ulcer surgery  2008    Prior to Admission medications   Not on File    Current Facility-Administered Medications  Medication Dose Route Frequency Provider Last Rate Last Dose  . 0.9 %  sodium chloride infusion   Intravenous Continuous Kelvin Cellar, MD 250 mL/hr at 03/01/15 [phone]    . antiseptic oral rinse (CPC / CETYLPYRIDINIUM CHLORIDE 0.05%) solution 7 mL  7 mL Mouth Rinse q12n4p Dianne Dun, NP      . chlorhexidine (PERIDEX) 0.12 % solution 15 mL  15 mL Mouth Rinse BID Dianne Dun, NP   15 mL at 02/28/15 2248  . docusate sodium (COLACE) capsule 100 mg  100 mg Oral BID Edwin Dada, MD   100 mg at 02/28/15 2247  . folic acid (FOLVITE) tablet 1 mg  1 mg Oral Daily Edwin Dada, MD      . hydrALAZINE (APRESOLINE) injection 10 mg  10 mg Intravenous Q6H PRN Dianne Dun, NP   10 mg at 02/28/15 2249  . HYDROmorphone (DILAUDID) injection 0.5 mg  0.5 mg Intravenous Q2H PRN Christopher P  Danford, MD   0.5 mg at 03/01/15 0803  . LORazepam (ATIVAN) tablet 1 mg  1 mg Oral Q6H PRN Edwin Dada, MD   1 mg at 03/01/15 0754   Or  . LORazepam (ATIVAN) injection 1 mg  1 mg Intravenous Q6H PRN Edwin Dada, MD   1 mg at 02/28/15 2044  . multivitamin with minerals tablet 1 tablet  1 tablet Oral Daily Edwin Dada, MD      . ondansetron (ZOFRAN) tablet 4 mg  4 mg Oral Q6H PRN Edwin Dada,  MD       Or  . ondansetron (ZOFRAN) injection 4 mg  4 mg Intravenous Q6H PRN Edwin Dada, MD   4 mg at 03/01/15 0100  . sodium chloride 0.9 % injection 3 mL  3 mL Intravenous Q12H Edwin Dada, MD   3 mL at 02/28/15 2248  . thiamine (VITAMIN B-1) tablet 100 mg  100 mg Oral Daily Edwin Dada, MD       Or  . thiamine (B-1) injection 100 mg  100 mg Intravenous Daily Edwin Dada, MD        Allergies as of 02/28/2015  . (No Known Allergies)    Family History  Problem Relation Age of Onset  . Diabetes Father   . Alcohol abuse Other     "many people'    Social History   Social History  . Marital Status: Divorced    Spouse Name: N/A  . Number of Children: N/A  . Years of Education: N/A   Occupational History  . Not on file.   Social History Main Topics  . Smoking status: Former Smoker    Types: Cigarettes    Quit date: 06/19/2014  . Smokeless tobacco: Never Used  . Alcohol Use: 20.4 oz/week    24 Cans of beer, 10 Standard drinks or equivalent per week  . Drug Use: Yes    Special: Cocaine  . Sexual Activity: Not on file   Other Topics Concern  . Not on file   Social History Narrative  . No narrative on file    Review of Systems: Gen: Denies fever or chills. Has felt weak, fatigued, and had a diminished appetite. CV: Denies chest pain, angina, palpitations, syncope, orthopnea, PND, peripheral edema, and claudication. Resp: Denies dyspnea at rest, dyspnea with exercise, cough, sputum, wheezing, coughing up blood, and pleurisy. GI: Denies vomiting blood. Admits to epigastric abdominal pain. Denies bright red blood per rectum or melena. GU : Denies urinary burning, blood in urine, urinary frequency, urinary hesitancy, nocturnal urination, and urinary incontinence. MS: Reports he feels weak all over. Derm: Denies rash, itching, dry skin, hives, moles, warts, or unhealing ulcers.  Psych: Denies depression or memory loss Heme: Denies  bruising, bleeding, and enlarged lymph nodes. Neuro:  Denies any headaches. Has been feeling dizzy   Physical Exam: Vital signs in last 24 hours: Temp:  [97.6 F (36.4 C)-99.4 F (37.4 C)] 98.8 F (37.1 C) (10/01 0552) Pulse Rate:  [103-161] 111 (10/01 0600) Resp:  [12-25] 12 (10/01 0600) BP: (95-175)/(61-101) 132/80 mmHg (10/01 0600) SpO2:  [97 %-100 %] 97 % (10/01 0600) Last BM Date: 02/27/15 General:   Alert but mildly confused chronically ill-appearing African-American male Head:  Normocephalic and atraumatic. Eyes:  Sclera anicteric.   Conjunctiva pink. Ears:  Normal auditory acuity. Nose:  No deformity, discharge,  or lesions. Mouth:  No deformity or lesions.   Neck:  Supple; no masses  or thyromegaly. Lungs:  Clear throughout to auscultation.   No wheezes, crackles, or rhonchi . Heart:  Regular rate and rhythm; no murmurs, clicks, rubs,  or gallops. Abdomen:  Soft, mild epigastric tenderness to palpation with no rebound or guarding, no distention or ascites appreciated BS active,nonpalp mass or hsm.   Rectal:  Brown stool, heme positive Msk:  Symmetrical without gross deformities. . Pulses:  Normal pulses noted. Extremities:  Without clubbing or edema. Neurologic: Alert, mildly confused,his name and knows he is in [facility] but is not sure of the. Tremorous. Psych: Alert but mildly confused. able to follow directions. Affect appropriate.  Intake/Output from previous day: 09/30 0701 - 10/01 0700 In: 2172.5 [I.V.:2172.5] Out: -  Intake/Output this shift:    Lab Results:  Recent Labs  02/28/15 1700 03/01/15 0110  WBC 22.4* 10.8*  HGB 13.0 11.2*  HCT 37.8* 31.6*  PLT 104* 87*   BMET  Recent Labs  02/28/15 1700 02/28/15 1955 03/01/15 0110  NA 140 135 134*  K 4.8 5.3* 4.5  CL 95* 99* 101  CO2 9* 13* 17*  GLUCOSE 38* 111* 106*  BUN 23* 22* 17  CREATININE 1.71* 1.54* 1.38*  CALCIUM 8.5* 7.4* 7.0*   LFT  Recent Labs  03/01/15 0110  PROT  5.5*  ALBUMIN 3.1*  AST 3630*  ALT 572*  ALKPHOS 174*  BILITOT 2.2*   PT/INR  Recent Labs  03/01/15 0110  LABPROT 16.7*  INR 1.34    Studies/Results: Ct Head Wo Contrast  02/28/2015   CLINICAL DATA:  Blurred vision.  Alcohol use.  Inability to walk.  EXAM: CT HEAD WITHOUT CONTRAST  CT CERVICAL SPINE WITHOUT CONTRAST  TECHNIQUE: Multidetector CT imaging of the head and cervical spine was performed following the standard protocol without intravenous contrast. Multiplanar CT image reconstructions of the cervical spine were also generated.  COMPARISON:  CT head and cervical spine 06/28/2007.  FINDINGS: CT HEAD FINDINGS  Premature for age cerebral and cerebellar atrophy. No evidence for acute infarction, hemorrhage, mass lesion, hydrocephalus, or extra-axial fluid.  Calvarium intact. No sinus or mastoid air fluid level. Cerebral volume loss since prior study of 2009.  CT CERVICAL SPINE FINDINGS  There is no visible cervical spine fracture, traumatic subluxation, prevertebral soft tissue swelling, or intraspinal hematoma. Reversal of the normal cervical lordotic curve nuchal ligament calcification. Disc space narrowing at C5-6 and C3-4, with widespread cervical facet disease. No neck masses. Minor atheromatous change at the carotid bifurcations. No visible pneumothorax. Staples seen in the LEFT lung apex.  IMPRESSION: Premature for age atrophy.  No acute intracranial findings.  Cervical spondylosis without fracture or traumatic subluxation.   Electronically Signed   By: Staci Righter M.D.   On: 02/28/2015 16:31   Ct Cervical Spine Wo Contrast  02/28/2015   CLINICAL DATA:  Blurred vision.  Alcohol use.  Inability to walk.  EXAM: CT HEAD WITHOUT CONTRAST  CT CERVICAL SPINE WITHOUT CONTRAST  TECHNIQUE: Multidetector CT imaging of the head and cervical spine was performed following the standard protocol without intravenous contrast. Multiplanar CT image reconstructions of the cervical spine were also  generated.  COMPARISON:  CT head and cervical spine 06/28/2007.  FINDINGS: CT HEAD FINDINGS  Premature for age cerebral and cerebellar atrophy. No evidence for acute infarction, hemorrhage, mass lesion, hydrocephalus, or extra-axial fluid.  Calvarium intact. No sinus or mastoid air fluid level. Cerebral volume loss since prior study of 2009.  CT CERVICAL SPINE FINDINGS  There is no visible  cervical spine fracture, traumatic subluxation, prevertebral soft tissue swelling, or intraspinal hematoma. Reversal of the normal cervical lordotic curve nuchal ligament calcification. Disc space narrowing at C5-6 and C3-4, with widespread cervical facet disease. No neck masses. Minor atheromatous change at the carotid bifurcations. No visible pneumothorax. Staples seen in the LEFT lung apex.  IMPRESSION: Premature for age atrophy.  No acute intracranial findings.  Cervical spondylosis without fracture or traumatic subluxation.   Electronically Signed   By: Staci Righter M.D.   On: 02/28/2015 16:31   Ct Abdomen Pelvis W Contrast  02/28/2015   CLINICAL DATA:  Abdominal pain, vomiting and generalized weakness. Alcohol intoxication. Fall.  EXAM: CT ABDOMEN AND PELVIS WITH CONTRAST  TECHNIQUE: Multidetector CT imaging of the abdomen and pelvis was performed using the standard protocol following bolus administration of intravenous contrast.  CONTRAST:  150mL OMNIPAQUE IOHEXOL 300 MG/ML  SOLN  COMPARISON:  12/22/2007 CT abdomen/pelvis.  FINDINGS: Lower chest: No significant pulmonary nodules or acute consolidative airspace disease.  Hepatobiliary: Diffuse hepatic steatosis. No liver mass. Distended gallbladder containing layering calcified gallstones measuring up to the 5 mm. Top-normal gallbladder wall thickness. No biliary ductal dilatation.  Pancreas: There is prominent fat stranding and ill-defined fluid surrounding the entire length of the mildly thickened pancreas, in keeping with acute pancreatitis. No pancreatic parenchymal  calcifications. No pancreatic mass. Top-normal caliber pancreatic duct. No focal measurable pancreatic or peripancreatic fluid collections. No areas of pancreatic parenchymal nonenhancement or gas.  Spleen: Normal size. No mass.  Adrenals/Urinary Tract: Normal adrenals. Status post left nephrectomy, with no mass or fluid collection in the left nephrectomy bed. Normal right kidney, with no right hydronephrosis and no right renal mass. Normal bladder.  Stomach/Bowel: Grossly normal stomach. Normal caliber small bowel with no small bowel wall thickening. Appendix is not discretely visualized. Normal large bowel with no diverticulosis, large bowel wall thickening or pericolonic fat stranding.  Vascular/Lymphatic: Atherosclerotic nonaneurysmal abdominal aorta. Patent portal, splenic, hepatic and right renal veins. No pathologically enlarged lymph nodes in the abdomen or pelvis.  Reproductive: Mild prostatomegaly with nonspecific internal prostatic calcification.  Other: No pneumoperitoneum, ascites or focal fluid collection.  Musculoskeletal: No aggressive appearing focal osseous lesions.  IMPRESSION: 1. Acute uncomplicated pancreatitis. 2. Cholelithiasis. Distended gallbladder. No specific findings to suggest acute cholecystitis. 3. No biliary ductal dilatation. 4. Diffuse hepatic steatosis. 5. Status post left nephrectomy, with no abnormal findings in the left nephrectomy bed.   Electronically Signed   By: Ilona Sorrel M.D.   On: 02/28/2015 19:37   Dg Abd Acute W/chest  02/28/2015   CLINICAL DATA:  Ethanol abuse. Abdominal pain. Generalized weakness.  EXAM: DG ABDOMEN ACUTE W/ 1V CHEST  COMPARISON:  Radiograph 04/30/2008  FINDINGS: Normal cardiac silhouette with ectatic aorta. There is postsurgical change in the LEFT upper lobe along the mediastinum. No effusion, infiltrate, or pneumothorax.  LEFT lower decubitus view demonstrates no intraperitoneal free air. AP view demonstrates no dilated loops of large or small  bowel. There is gas the rectum. No pathologic calcifications. No organomegaly.  IMPRESSION: 1.  No acute cardiopulmonary process. 2. No evidence of bowel obstruction or intraperitoneal free air.   Electronically Signed   By: Suzy Bouchard M.D.   On: 02/28/2015 16:45    IMPRESSION/PLAN: 59 year old male with a personal history of renal cell carcinoma status post nephrectomy, and a long-standing history of alcohol abuse/substance abuse, admitted with elevated LFTs suggestive of an alcoholic hepatitis, along with pancreatitis, also likely due to his alcoholism. Hepatitis may have  an ischemic etiology in part is well as patient has been abusing cocaine. Hepatitis serologies, HIV, Tylenol level have been ordered as well as a right upper quadrant ultrasound. INR 1.34, t bili2.2--not a candidate for steroids at this time. Alcohol abuse--CIWA protocol. Supportive care. Thrombocytopenia. Likely to to his liver disease secondary to EtOH. INR normal at this time. Pancreatitis--likely secondary to EtOH. Continue analgesia and IV fluids, bowel rest. Heme positive stools.--Patient reported hematemesis yesterday.? Gastritis/ulcer/portal gastropathy/varices? Protonix drip. Monitor hemoglobin. May need EGD at some point to assess for gastritis, ulcer, varices, etc. AK I--likely due to dehydration. Creatinine improving with IV fluids. Will review with attending as to further recommendations.   Hvozdovic, Deloris Ping 03/01/2015,  Pager [phone]  GI Attending Note   Chart was reviewed and patient was examined. X-rays and lab were reviewed.  Very unfortunate situation.  LFT abnormalities suggest shock liver or drug induced hepatitis although for the former one would expect an elevated INR.  Pt does not appear to be in liver failure.  In view of overall situation I think we need to define our treatment parameters and goals.  Would hold endoscopic procedures in the absence of overt GI bleeding.  Sandy Salaam. Deatra Ina,  M.D., [facility] Gastroenterology Cell [phone]

## 2015-03-01 NOTE — Progress Notes (Signed)
Visit on referral by staff. Mr Jeremy [name] not awake enough to talk. Will try to visit at another time.  Jeremy [name]. Jeremy [name], DMin, MDiv Chaplain

## 2015-03-02 DIAGNOSIS — K7200 Acute and subacute hepatic failure without coma: Secondary | ICD-10-CM

## 2015-03-02 DIAGNOSIS — K860 Alcohol-induced chronic pancreatitis: Secondary | ICD-10-CM

## 2015-03-02 DIAGNOSIS — K759 Inflammatory liver disease, unspecified: Secondary | ICD-10-CM

## 2015-03-02 DIAGNOSIS — K921 Melena: Secondary | ICD-10-CM

## 2015-03-02 LAB — COMPREHENSIVE METABOLIC PANEL
ALK PHOS: 126 U/L (ref 38–126)
ALT: 288 U/L — ABNORMAL HIGH (ref 17–63)
ALT: 250 U/L — AB (ref 17–63)
ANION GAP: 5 (ref 5–15)
AST: 1085 U/L — ABNORMAL HIGH (ref 15–41)
AST: 718 U/L — AB (ref 15–41)
Albumin: 2.7 g/dL — ABNORMAL LOW (ref 3.5–5.0)
Albumin: 2.7 g/dL — ABNORMAL LOW (ref 3.5–5.0)
Alkaline Phosphatase: 130 U/L — ABNORMAL HIGH (ref 38–126)
Anion gap: 4 — ABNORMAL LOW (ref 5–15)
BILIRUBIN TOTAL: 0.9 mg/dL (ref 0.3–1.2)
BUN: 6 mg/dL (ref 6–20)
CALCIUM: 7.6 mg/dL — AB (ref 8.9–10.3)
CHLORIDE: 105 mmol/L (ref 101–111)
CHLORIDE: 106 mmol/L (ref 101–111)
CO2: 24 mmol/L (ref 22–32)
CO2: 25 mmol/L (ref 22–32)
CREATININE: 0.8 mg/dL (ref 0.61–1.24)
Calcium: 7.6 mg/dL — ABNORMAL LOW (ref 8.9–10.3)
Creatinine, Ser: 0.82 mg/dL (ref 0.61–1.24)
GFR calc non Af Amer: >60 mL/min (ref >60)
Glucose, Bld: 141 mg/dL — ABNORMAL HIGH (ref 65–99)
Glucose, Bld: 113 mg/dL — ABNORMAL HIGH (ref 65–99)
POTASSIUM: 3.4 mmol/L — AB (ref 3.5–5.1)
Potassium: 3.4 mmol/L — ABNORMAL LOW (ref 3.5–5.1)
SODIUM: 134 mmol/L — AB (ref 135–145)
Sodium: 135 mmol/L (ref 135–145)
TOTAL PROTEIN: 5 g/dL — AB (ref 6.5–8.1)
Total Bilirubin: 1 mg/dL (ref 0.3–1.2)
Total Protein: 5.1 g/dL — ABNORMAL LOW (ref 6.5–8.1)

## 2015-03-02 LAB — CBC
HCT: 25.4 % — ABNORMAL LOW (ref 39.0–52.0)
HEMATOCRIT: 25.9 % — AB (ref 39.0–52.0)
HEMOGLOBIN: 9.2 g/dL — AB (ref 13.0–17.0)
Hemoglobin: 9.1 g/dL — ABNORMAL LOW (ref 13.0–17.0)
MCH: 34.2 pg — AB (ref 26.0–34.0)
MCH: 33.1 pg (ref 26.0–34.0)
MCHC: 36.2 g/dL — ABNORMAL HIGH (ref 30.0–36.0)
MCHC: 35.1 g/dL (ref 30.0–36.0)
MCV: 94.4 fL (ref 78.0–100.0)
MCV: 94.2 fL (ref 78.0–100.0)
PLATELETS: 51 10*3/uL — AB (ref 150–400)
Platelets: 60 10*3/uL — ABNORMAL LOW (ref 150–400)
RBC: 2.69 MIL/uL — AB (ref 4.22–5.81)
RBC: 2.75 MIL/uL — AB (ref 4.22–5.81)
RDW: 14.6 % (ref 11.5–15.5)
RDW: 14.8 % (ref 11.5–15.5)
WBC: 7.8 10*3/uL (ref 4.0–10.5)
WBC: 7.4 10*3/uL (ref 4.0–10.5)

## 2015-03-02 LAB — HEPATITIS B SURFACE ANTIGEN: Hepatitis B Surface Ag: NEGATIVE

## 2015-03-02 LAB — PROTIME-INR
INR: 1.04 (ref 0.00–1.49)
Prothrombin Time: 13.8 seconds (ref 11.6–15.2)

## 2015-03-02 LAB — HEPATITIS C ANTIBODY (REFLEX)

## 2015-03-02 LAB — HIV ANTIBODY (ROUTINE TESTING W REFLEX): HIV Screen 4th Generation wRfx: NONREACTIVE

## 2015-03-02 LAB — HEPATITIS A ANTIBODY, TOTAL: Hep A Total Ab: NEGATIVE

## 2015-03-02 LAB — GLUCOSE, CAPILLARY: Glucose-Capillary: 153 mg/dL — ABNORMAL HIGH (ref 65–99)

## 2015-03-02 LAB — HCV COMMENT:

## 2015-03-02 LAB — LIPASE, BLOOD: Lipase: 392 U/L — ABNORMAL HIGH (ref 22–51)

## 2015-03-02 LAB — HEPATITIS B SURFACE ANTIBODY,QUALITATIVE: Hep B S Ab: NONREACTIVE

## 2015-03-02 LAB — HEPATITIS B CORE ANTIBODY, IGM: Hep B C IgM: NEGATIVE

## 2015-03-02 MED ORDER — POTASSIUM CHLORIDE CRYS ER 20 MEQ PO TBCR
40.0000 meq | EXTENDED_RELEASE_TABLET | Freq: Once | ORAL | Status: AC
  Administered 2015-03-02: 40 meq via ORAL
  Filled 2015-03-02: qty 2

## 2015-03-02 MED ORDER — PIPERACILLIN-TAZOBACTAM 3.375 G IVPB
3.3750 g | Freq: Three times a day (TID) | INTRAVENOUS | Status: DC
  Administered 2015-03-02 – 2015-03-03 (×3): 3.375 g via INTRAVENOUS
  Filled 2015-03-02 (×3): qty 50

## 2015-03-02 NOTE — Progress Notes (Signed)
Initial Nutrition Assessment  DOCUMENTATION CODES:    Severe malnutrition in the context of chronic illness  INTERVENTION:  1.  Modify diet; resume PO diet once medically appropriate per MD discretion.   NUTRITION DIAGNOSIS:   Inadequate oral intake related to inability to eat as evidenced by NPO status.  GOAL:   Patient will meet greater than or equal to 90% of their needs, Weight gain  MONITOR:   Diet advancement, PO intake, Weight trends  REASON FOR ASSESSMENT: MST  ASSESSMENT:   59 year old male with a past medical history renal cell carcinoma status post left nephrectomy in 2009, chronic alcoholism, homelessness, admitted to the medicine service on 02/28/2015 when he presented to the emergency department with complaints of abdominal pain associate with nausea/vomiting and generalized weakness. He reports drinking 2/5 of liquor on a daily basis. On admission lab work revealed an AST of 2177 with ALT of 566, bicarbonate of 9 with creatinine of 1.7. He was admitted to the stepdown units and started on IV fluids. He has required Ativan for signs symptoms of alcohol withdrawal  RD drawn to chart due to MST.   Patient admitted with weakness found to have pancreatitis and elevated LFTs.  Patient with long-standing history of alcohol abuse, homelessness. Followed by GI.   RD attempted assessment, pt drowsy, unable to arouse for full conversation.  Per RN, has had intermittent confusion.   Nutrition Focused Physical Exam: Subcutaneous Fat:  Orbital Region: mild wasting Upper Arm Region: moderate-severe wasting Thoracic and Lumbar Region: moderate-severe wasting  Muscle:  Temple Region: mild wasting Clavicle Bone Region: severe wasting Clavicle and Acromion Bone Region: severe wasting Scapular Bone Region: unable to assess Dorsal Hand: unable to assess Patellar Region: severe  Anterior Thigh Region: severe Posterior Calf Region: severe wasting  Edema: none  present  Currently NPO.  Diet Order:  Diet NPO time specified  Skin:     Last BM:  10/2  Height:   Ht Readings from Last 1 Encounters:  03/01/15 5\' 9"  (1.753 m)    Weight:   Wt Readings from Last 1 Encounters:  03/01/15 147 lb 7.8 oz (66.9 kg)    Ideal Body Weight:  72.7 kg  BMI:  Body mass index is 21.77 kg/(m^2).  Estimated Nutritional Needs:   Kcal:  1800-2020  Protein:  85g  Fluid:  2 L/d  EDUCATION NEEDS:   Education needs no appropriate at this time  Weekend RD coverage:  Brynda Greathouse, MS RD LDN Weekend/After hours pager: [phone]

## 2015-03-02 NOTE — Progress Notes (Signed)
Pt threatened to leave AMA since he is npo and cannot get anything to eat. Pt educated about reason for him being npo esp[ecially with his continuing to have abdominal pain. Pt still refused and started to climb out of bed in an effort to leave. MD made aware. Upon returning to room pt reports he changed his mind. More education provided to pt about reasons for  and benefits of him being NPO at this time. Pt states"I'm sorry" and decided to stay. Order for clear liquid diet given by Dr. Coralyn Pear.

## 2015-03-02 NOTE — Progress Notes (Signed)
Utilization Review Completed.Brandi Armato T10/07/2014  

## 2015-03-02 NOTE — Progress Notes (Signed)
Patient ID: Jeremy [name], male   DOB: 1956-04-06, 59 y.o.   MRN: [id number] Chief Complaint:  Abdominal pain  History of Present Illness:  Jeremy [name] is an 59 y.o. male who is admitted with ethanolism and hepatitis.  LFTs support this diagnosis but on ultrasound he has some GB sludge and wall thickening (4 mm).    Past Medical History  Diagnosis Date  . FH: kidney cancer   . Cancer 2006    kidney cancer  . Chronic kidney disease     RCC, s/p resection in 2009    Past Surgical History  Procedure Laterality Date  . Nephrectomy    . Left lobe of lung removed  2006  . Ulcer surgery  2008    Current Facility-Administered Medications  Medication Dose Route Frequency Provider Last Rate Last Dose  . 0.9 %  sodium chloride infusion   Intravenous Continuous Kelvin Cellar, MD 100 mL/hr at 03/02/15 0810    . antiseptic oral rinse (CPC / CETYLPYRIDINIUM CHLORIDE 0.05%) solution 7 mL  7 mL Mouth Rinse q12n4p Dianne Dun, NP   7 mL at 03/02/15 1200  . chlorhexidine (PERIDEX) 0.12 % solution 15 mL  15 mL Mouth Rinse BID Dianne Dun, NP   15 mL at 03/02/15 1029  . cloNIDine (CATAPRES) tablet 0.1 mg  0.1 mg Oral BID Kelvin Cellar, MD   0.1 mg at 03/02/15 1029  . docusate sodium (COLACE) capsule 100 mg  100 mg Oral BID Edwin Dada, MD   100 mg at 02/28/15 2247  . folic acid (FOLVITE) tablet 1 mg  1 mg Oral Daily Edwin Dada, MD   1 mg at 03/02/15 1029  . hydrALAZINE (APRESOLINE) injection 10 mg  10 mg Intravenous Q6H PRN Dianne Dun, NP   10 mg at 02/28/15 2249  . HYDROmorphone (DILAUDID) injection 0.5 mg  0.5 mg Intravenous Q2H PRN Edwin Dada, MD   0.5 mg at 03/02/15 1034  . LORazepam (ATIVAN) tablet 1 mg  1 mg Oral Q6H PRN Edwin Dada, MD   1 mg at 03/02/15 0501   Or  . LORazepam (ATIVAN) injection 1 mg  1 mg Intravenous Q6H PRN Edwin Dada, MD   1 mg at 03/02/15 1249  . multivitamin with minerals tablet 1  tablet  1 tablet Oral Daily Edwin Dada, MD   1 tablet at 03/02/15 1031  . ondansetron (ZOFRAN) tablet 4 mg  4 mg Oral Q6H PRN Edwin Dada, MD       Or  . ondansetron (ZOFRAN) injection 4 mg  4 mg Intravenous Q6H PRN Edwin Dada, MD   4 mg at 03/01/15 0100  . pantoprazole (PROTONIX) 80 mg in sodium chloride 0.9 % 250 mL (0.32 mg/mL) infusion  8 mg/hr Intravenous Continuous Kelvin Cellar, MD 25 mL/hr at 03/02/15 0603 8 mg/hr at 03/02/15 0603  . piperacillin-tazobactam (ZOSYN) IVPB 3.375 g  3.375 g Intravenous Q8H Kelvin Cellar, MD   3.375 g at 03/02/15 1022  . sodium chloride 0.9 % injection 3 mL  3 mL Intravenous Q12H Edwin Dada, MD   3 mL at 02/28/15 2248  . thiamine (VITAMIN B-1) tablet 100 mg  100 mg Oral Daily Edwin Dada, MD   100 mg at 03/02/15 1031   Or  . thiamine (B-1) injection 100 mg  100 mg Intravenous Daily Edwin Dada, MD       Review of patient's allergies indicates no known allergies.  Family History  Problem Relation Age of Onset  . Diabetes Father   . Alcohol abuse Other     "many people'   Social History:   reports that he quit smoking about 8 months ago. His smoking use included Cigarettes. He has never used smokeless tobacco. He reports that he drinks about 20.4 oz of alcohol per week. He reports that he uses illicit drugs (Cocaine).   REVIEW OF SYSTEMS : Negative except for see problem list  Physical Exam:   Blood pressure 130/84, pulse 73, temperature 98.6 F (37 C), temperature source Oral, resp. rate 17, height _0  (1.753 m), weight 66.9 kg (147 lb 7.8 oz), SpO2 100 %. Body mass index is 21.77 kg/(m^2).  Gen:  WDWN AAM NAD  Abdomen:  Flat and complaining of most discomfort in the mid abdomen.  Right upper quadrant pain to deep palpation.   LABORATORY RESULTS: Results for orders placed or performed during the hospital encounter of 02/28/15 (from the past 48 hour(s))  Urine rapid drug screen  (hosp performed)     Status: Abnormal   Collection Time: 02/28/15  4:04 PM  Result Value Ref Range   Opiates NONE DETECTED NONE DETECTED   Cocaine POSITIVE (A) NONE DETECTED   Benzodiazepines NONE DETECTED NONE DETECTED   Amphetamines NONE DETECTED NONE DETECTED   Tetrahydrocannabinol NONE DETECTED NONE DETECTED   Barbiturates NONE DETECTED NONE DETECTED    Comment:        DRUG SCREEN FOR MEDICAL PURPOSES ONLY.  IF CONFIRMATION IS NEEDED FOR ANY PURPOSE, NOTIFY LAB WITHIN 5 DAYS.        LOWEST DETECTABLE LIMITS FOR URINE DRUG SCREEN Drug Class       Cutoff (ng/mL) Amphetamine      1000 Barbiturate      200 Benzodiazepine   545 Tricyclics       625 Opiates          300 Cocaine          300 THC              50   CBC with Differential     Status: Abnormal   Collection Time: 02/28/15  5:00 PM  Result Value Ref Range   WBC 22.4 (H) 4.0 - 10.5 K/uL   RBC 3.85 (L) 4.22 - 5.81 MIL/uL   Hemoglobin 13.0 13.0 - 17.0 g/dL   HCT 37.8 (L) 39.0 - 52.0 %   MCV 98.2 78.0 - 100.0 fL   MCH 33.8 26.0 - 34.0 pg   MCHC 34.4 30.0 - 36.0 g/dL   RDW 14.1 11.5 - 15.5 %   Platelets 104 (L) 150 - 400 K/uL    Comment: SPECIMEN CHECKED FOR CLOTS REPEATED TO VERIFY PLATELET COUNT CONFIRMED BY SMEAR    Neutrophils Relative % 86 %   Lymphocytes Relative 8 %   Monocytes Relative 6 %   Eosinophils Relative 0 %   Basophils Relative 0 %   Neutro Abs 19.3 (H) 1.7 - 7.7 K/uL   Lymphs Abs 1.8 0.7 - 4.0 K/uL   Monocytes Absolute 1.3 (H) 0.1 - 1.0 K/uL   Eosinophils Absolute 0.0 0.0 - 0.7 K/uL   Basophils Absolute 0.0 0.0 - 0.1 K/uL   Smear Review MORPHOLOGY UNREMARKABLE   Comprehensive metabolic panel     Status: Abnormal   Collection Time: 02/28/15  5:00 PM  Result Value Ref Range   Sodium 140 135 - 145 mmol/L   Potassium 4.8 3.5 - 5.1 mmol/L  Chloride 95 (L) 101 - 111 mmol/L   CO2 9 (L) 22 - 32 mmol/L   Glucose, Bld 38 (LL) 65 - 99 mg/dL    Comment: CRITICAL RESULT CALLED TO, READ BACK BY AND  VERIFIED WITH: J.MUCKENFUSS RN ON 9.30.16 AT 1805 BY W.BUCHHOLZ.    BUN 23 (H) 6 - 20 mg/dL   Creatinine, Ser 1.71 (H) 0.61 - 1.24 mg/dL   Calcium 8.5 (L) 8.9 - 10.3 mg/dL   Total Protein 7.7 6.5 - 8.1 g/dL   Albumin 4.2 3.5 - 5.0 g/dL   AST 2177 (H) 15 - 41 U/L   ALT 566 (H) 17 - 63 U/L   Alkaline Phosphatase 190 (H) 38 - 126 U/L   Total Bilirubin 1.3 (H) 0.3 - 1.2 mg/dL   GFR calc non Af Amer 41 (L) >60 mL/min   GFR calc Af Amer 48 (L) >60 mL/min    Comment: (NOTE) The eGFR has been calculated using the CKD EPI equation. This calculation has not been validated in all clinical situations. eGFR's persistently <60 mL/min signify possible Chronic Kidney Disease.    Anion gap 36 (H) 5 - 15  Lipase, blood     Status: Abnormal   Collection Time: 02/28/15  5:00 PM  Result Value Ref Range   Lipase 151 (H) 22 - 51 U/L  Ethanol     Status: Abnormal   Collection Time: 02/28/15  5:01 PM  Result Value Ref Range   Alcohol, Ethyl (B) 43 (H) <5 mg/dL    Comment:        LOWEST DETECTABLE LIMIT FOR SERUM ALCOHOL IS 5 mg/dL FOR MEDICAL PURPOSES ONLY   I-stat troponin, ED     Status: None   Collection Time: 02/28/15  5:07 PM  Result Value Ref Range   Troponin i, poc 0.02 0.00 - 0.08 ng/mL   Comment 3            Comment: Due to the release kinetics of cTnI, a negative result within the first hours of the onset of symptoms does not rule out myocardial infarction with certainty. If myocardial infarction is still suspected, repeat the test at appropriate intervals.   CBG monitoring, ED     Status: Abnormal   Collection Time: 02/28/15  6:05 PM  Result Value Ref Range   Glucose-Capillary 25 (LL) 65 - 99 mg/dL   Comment 1 Notify RN   CBG monitoring, ED     Status: Abnormal   Collection Time: 02/28/15  6:35 PM  Result Value Ref Range   Glucose-Capillary 153 (H) 65 - 99 mg/dL  Hepatitis panel, acute     Status: None   Collection Time: 02/28/15  7:55 PM  Result Value Ref Range    Hepatitis B Surface Ag Negative Negative   HCV Ab <0.1 0.0 - 0.9 s/co ratio    Comment: (NOTE)                                  Negative:     < 0.8                             Indeterminate: 0.8 - 0.9                                  Positive:     >  0.9 The CDC recommends that a positive HCV antibody result be followed up with a HCV Nucleic Acid Amplification test (383818). Performed At: [facility] [place], [place] [id number] Lindon Romp MD OV:[id number]    Hep A IgM Negative Negative   Hep B C IgM Negative Negative  Basic metabolic panel     Status: Abnormal   Collection Time: 02/28/15  7:55 PM  Result Value Ref Range   Sodium 135 135 - 145 mmol/L   Potassium 5.3 (H) 3.5 - 5.1 mmol/L   Chloride 99 (L) 101 - 111 mmol/L   CO2 13 (L) 22 - 32 mmol/L   Glucose, Bld 111 (H) 65 - 99 mg/dL   BUN 22 (H) 6 - 20 mg/dL   Creatinine, Ser 1.54 (H) 0.61 - 1.24 mg/dL   Calcium 7.4 (L) 8.9 - 10.3 mg/dL   GFR calc non Af Amer 47 (L) >60 mL/min   GFR calc Af Amer 54 (L) >60 mL/min    Comment: (NOTE) The eGFR has been calculated using the CKD EPI equation. This calculation has not been validated in all clinical situations. eGFR's persistently <60 mL/min signify possible Chronic Kidney Disease.    Anion gap 23 (H) 5 - 15  I-Stat CG4 Lactic Acid, ED     Status: Abnormal   Collection Time: 02/28/15  8:04 PM  Result Value Ref Range   Lactic Acid, Venous 4.90 (HH) 0.5 - 2.0 mmol/L   Comment NOTIFIED PHYSICIAN   Creatinine, urine, random     Status: None   Collection Time: 02/28/15  8:25 PM  Result Value Ref Range   Creatinine, Urine 38.72 mg/dL    Comment: Performed at [facility]  Sodium, urine, random     Status: None   Collection Time: 02/28/15  8:25 PM  Result Value Ref Range   Sodium, Ur 59 mmol/L    Comment: Performed at [facility]  Glucose, capillary     Status: Abnormal   Collection Time: 02/28/15  9:42 PM  Result Value Ref Range    Glucose-Capillary 144 (H) 65 - 99 mg/dL  Osmolality     Status: None   Collection Time: 02/28/15 10:38 PM  Result Value Ref Range   Osmolality 298 275 - 300 mOsm/kg    Comment: Performed at [facility]  Lactic acid, plasma     Status: None   Collection Time: 02/28/15 10:38 PM  Result Value Ref Range   Lactic Acid, Venous 1.8 0.5 - 2.0 mmol/L  MRSA PCR Screening     Status: None   Collection Time: 02/28/15 11:31 PM  Result Value Ref Range   MRSA by PCR NEGATIVE NEGATIVE    Comment:        The GeneXpert MRSA Assay (FDA approved for NASAL specimens only), is one component of a comprehensive MRSA colonization surveillance program. It is not intended to diagnose MRSA infection nor to guide or monitor treatment for MRSA infections.   Comprehensive metabolic panel     Status: Abnormal   Collection Time: 03/01/15  1:10 AM  Result Value Ref Range   Sodium 134 (L) 135 - 145 mmol/L   Potassium 4.5 3.5 - 5.1 mmol/L   Chloride 101 101 - 111 mmol/L   CO2 17 (L) 22 - 32 mmol/L   Glucose, Bld 106 (H) 65 - 99 mg/dL   BUN 17 6 - 20 mg/dL   Creatinine, Ser 1.38 (H) 0.61 - 1.24 mg/dL   Calcium 7.0 (  L) 8.9 - 10.3 mg/dL   Total Protein 5.5 (L) 6.5 - 8.1 g/dL   Albumin 3.1 (L) 3.5 - 5.0 g/dL   AST 3630 (H) 15 - 41 U/L    Comment: RESULTS CONFIRMED BY MANUAL DILUTION   ALT 572 (H) 17 - 63 U/L   Alkaline Phosphatase 174 (H) 38 - 126 U/L   Total Bilirubin 2.2 (H) 0.3 - 1.2 mg/dL   GFR calc non Af Amer 54 (L) >60 mL/min   GFR calc Af Amer >60 >60 mL/min    Comment: (NOTE) The eGFR has been calculated using the CKD EPI equation. This calculation has not been validated in all clinical situations. eGFR's persistently <60 mL/min signify possible Chronic Kidney Disease.    Anion gap 16 (H) 5 - 15  CBC     Status: Abnormal   Collection Time: 03/01/15  1:10 AM  Result Value Ref Range   WBC 10.8 (H) 4.0 - 10.5 K/uL   RBC 3.32 (L) 4.22 - 5.81 MIL/uL   Hemoglobin 11.2 (L) 13.0 - 17.0  g/dL   HCT 31.6 (L) 39.0 - 52.0 %   MCV 95.2 78.0 - 100.0 fL   MCH 33.7 26.0 - 34.0 pg   MCHC 35.4 30.0 - 36.0 g/dL   RDW 14.5 11.5 - 15.5 %   Platelets 87 (L) 150 - 400 K/uL    Comment: CONSISTENT WITH PREVIOUS RESULT  Lactic acid, plasma     Status: None   Collection Time: 03/01/15  1:10 AM  Result Value Ref Range   Lactic Acid, Venous 1.2 0.5 - 2.0 mmol/L  Protime-INR     Status: Abnormal   Collection Time: 03/01/15  1:10 AM  Result Value Ref Range   Prothrombin Time 16.7 (H) 11.6 - 15.2 seconds   INR 1.34 0.00 - 1.49  Glucose, capillary     Status: Abnormal   Collection Time: 03/01/15  7:45 AM  Result Value Ref Range   Glucose-Capillary 128 (H) 65 - 99 mg/dL  HIV antibody     Status: None   Collection Time: 03/01/15  9:25 AM  Result Value Ref Range   HIV Screen 4th Generation wRfx Non Reactive Non Reactive    Comment: (NOTE) Performed At: [facility] [place], [place] [id number] Lindon Romp MD IR:[id number]   Hepatitis B surface antigen     Status: None   Collection Time: 03/01/15  9:25 AM  Result Value Ref Range   Hepatitis B Surface Ag Negative Negative    Comment: (NOTE) Performed At: [facility] [address] [place], [place] [id number] Lindon Romp MD AT:[id number]   Hepatitis B core antibody, IgM     Status: None   Collection Time: 03/01/15  9:25 AM  Result Value Ref Range   Hep B C IgM Negative Negative    Comment: (NOTE) Performed At: [facility] [place], [place] [id number] Lindon Romp MD [id number]   Hepatitis B surface antibody     Status: None   Collection Time: 03/01/15  9:25 AM  Result Value Ref Range   Hep B S Ab Non Reactive     Comment: (NOTE)              Non Reactive: Inconsistent with immunity,                            less than 10 mIU/mL  Reactive:     Consistent with immunity,                            greater than 9.9 mIU/mL Performed At: [facility] [place], [place] [id number] Lindon Romp MD [id number]   Hepatitis A antibody, total     Status: None   Collection Time: 03/01/15  9:25 AM  Result Value Ref Range   Hep A Total Ab Negative Negative    Comment: (NOTE) Performed At: [facility] [place], [place] [id number] Lindon Romp MD [id number]   Hepatitis c antibody (reflex)     Status: None   Collection Time: 03/01/15  9:25 AM  Result Value Ref Range   HCV Ab <0.1 0.0 - 0.9 s/co ratio    Comment: (NOTE) Performed At: [facility] [address] [place], [place] [id number] Lindon Romp MD [id number]   Acetaminophen level     Status: Abnormal   Collection Time: 03/01/15  9:25 AM  Result Value Ref Range   Acetaminophen (Tylenol), Serum <10 (L) 10 - 30 ug/mL    Comment:        THERAPEUTIC CONCENTRATIONS VARY SIGNIFICANTLY. A RANGE OF 10-30 ug/mL MAY BE AN EFFECTIVE CONCENTRATION FOR MANY PATIENTS. HOWEVER, SOME ARE BEST TREATED AT CONCENTRATIONS OUTSIDE THIS RANGE. ACETAMINOPHEN CONCENTRATIONS >150 ug/mL AT 4 HOURS AFTER INGESTION AND >50 ug/mL AT 12 HOURS AFTER INGESTION ARE OFTEN ASSOCIATED WITH TOXIC REACTIONS.   HCV Comment:     Status: None   Collection Time: 03/01/15  9:25 AM  Result Value Ref Range   Comment: Comment     Comment: (NOTE) Non reactive HCV antibody screen is consistent with no HCV infection, unless recent infection is suspected or other evidence exists to indicate HCV infection. Performed At: [facility] [place], [place] [id number] Lindon Romp MD IO:[id number]   C difficile quick scan w PCR reflex     Status: Abnormal   Collection Time: 03/01/15  3:30 PM  Result Value Ref Range   C Diff antigen POSITIVE (A) NEGATIVE   C Diff toxin NEGATIVE NEGATIVE   C Diff interpretation      C. difficile present, but toxin not detected. This indicates colonization. In most cases, this does  not require treatment. If patient has signs and symptoms consistent with colitis, consider treatment. Requires ENTERIC precautions.  Comprehensive metabolic panel     Status: Abnormal   Collection Time: 03/02/15  3:51 AM  Result Value Ref Range   Sodium 134 (L) 135 - 145 mmol/L   Potassium 3.4 (L) 3.5 - 5.1 mmol/L    Comment: DELTA CHECK NOTED   Chloride 105 101 - 111 mmol/L   CO2 24 22 - 32 mmol/L   Glucose, Bld 141 (H) 65 - 99 mg/dL   BUN 6 6 - 20 mg/dL   Creatinine, Ser 0.82 0.61 - 1.24 mg/dL   Calcium 7.6 (L) 8.9 - 10.3 mg/dL   Total Protein 5.1 (L) 6.5 - 8.1 g/dL   Albumin 2.7 (L) 3.5 - 5.0 g/dL   AST 1085 (H) 15 - 41 U/L   ALT 288 (H) 17 - 63 U/L   Alkaline Phosphatase 130 (H) 38 - 126 U/L   Total Bilirubin 1.0 0.3 - 1.2 mg/dL   GFR calc non Af Amer >60 >60 mL/min   GFR calc Af Amer >60 >60 mL/min    Comment: (NOTE)  The eGFR has been calculated using the CKD EPI equation. This calculation has not been validated in all clinical situations. eGFR's persistently <60 mL/min signify possible Chronic Kidney Disease.    Anion gap 5 5 - 15  CBC     Status: Abnormal   Collection Time: 03/02/15  3:51 AM  Result Value Ref Range   WBC 7.8 4.0 - 10.5 K/uL   RBC 2.69 (L) 4.22 - 5.81 MIL/uL   Hemoglobin 9.2 (L) 13.0 - 17.0 g/dL   HCT 25.4 (L) 39.0 - 52.0 %   MCV 94.4 78.0 - 100.0 fL   MCH 34.2 (H) 26.0 - 34.0 pg   MCHC 36.2 (H) 30.0 - 36.0 g/dL   RDW 14.6 11.5 - 15.5 %   Platelets 60 (L) 150 - 400 K/uL    Comment: REPEATED TO VERIFY CONSISTENT WITH PREVIOUS RESULT   Lipase, blood     Status: Abnormal   Collection Time: 03/02/15  3:51 AM  Result Value Ref Range   Lipase 392 (H) 22 - 51 U/L  Glucose, capillary     Status: Abnormal   Collection Time: 03/02/15  7:46 AM  Result Value Ref Range   Glucose-Capillary 153 (H) 65 - 99 mg/dL  CBC Once     Status: Abnormal   Collection Time: 03/02/15 12:09 PM  Result Value Ref Range   WBC 7.4 4.0 - 10.5 K/uL   RBC 2.75 (L) 4.22 - 5.81  MIL/uL   Hemoglobin 9.1 (L) 13.0 - 17.0 g/dL   HCT 25.9 (L) 39.0 - 52.0 %   MCV 94.2 78.0 - 100.0 fL   MCH 33.1 26.0 - 34.0 pg   MCHC 35.1 30.0 - 36.0 g/dL   RDW 14.8 11.5 - 15.5 %   Platelets 51 (L) 150 - 400 K/uL    Comment: CONSISTENT WITH PREVIOUS RESULT  Protime-INR Once     Status: None   Collection Time: 03/02/15 12:09 PM  Result Value Ref Range   Prothrombin Time 13.8 11.6 - 15.2 seconds   INR 1.04 0.00 - 1.49  Comprehensive metabolic panel Once     Status: Abnormal   Collection Time: 03/02/15 12:09 PM  Result Value Ref Range   Sodium 135 135 - 145 mmol/L   Potassium 3.4 (L) 3.5 - 5.1 mmol/L   Chloride 106 101 - 111 mmol/L   CO2 25 22 - 32 mmol/L   Glucose, Bld 113 (H) 65 - 99 mg/dL   BUN <5 (L) 6 - 20 mg/dL   Creatinine, Ser 0.80 0.61 - 1.24 mg/dL   Calcium 7.6 (L) 8.9 - 10.3 mg/dL   Total Protein 5.0 (L) 6.5 - 8.1 g/dL   Albumin 2.7 (L) 3.5 - 5.0 g/dL   AST 718 (H) 15 - 41 U/L   ALT 250 (H) 17 - 63 U/L   Alkaline Phosphatase 126 38 - 126 U/L   Total Bilirubin 0.9 0.3 - 1.2 mg/dL   GFR calc non Af Amer >60 >60 mL/min   GFR calc Af Amer >60 >60 mL/min    Comment: (NOTE) The eGFR has been calculated using the CKD EPI equation. This calculation has not been validated in all clinical situations. eGFR's persistently <60 mL/min signify possible Chronic Kidney Disease.    Anion gap 4 (L) 5 - 15     RADIOLOGY RESULTS: Ct Head Wo Contrast  02/28/2015   CLINICAL DATA:  Blurred vision.  Alcohol use.  Inability to walk.  EXAM: CT HEAD WITHOUT CONTRAST  CT CERVICAL  SPINE WITHOUT CONTRAST  TECHNIQUE: Multidetector CT imaging of the head and cervical spine was performed following the standard protocol without intravenous contrast. Multiplanar CT image reconstructions of the cervical spine were also generated.  COMPARISON:  CT head and cervical spine 06/28/2007.  FINDINGS: CT HEAD FINDINGS  Premature for age cerebral and cerebellar atrophy. No evidence for acute infarction,  hemorrhage, mass lesion, hydrocephalus, or extra-axial fluid.  Calvarium intact. No sinus or mastoid air fluid level. Cerebral volume loss since prior study of 2009.  CT CERVICAL SPINE FINDINGS  There is no visible cervical spine fracture, traumatic subluxation, prevertebral soft tissue swelling, or intraspinal hematoma. Reversal of the normal cervical lordotic curve nuchal ligament calcification. Disc space narrowing at C5-6 and C3-4, with widespread cervical facet disease. No neck masses. Minor atheromatous change at the carotid bifurcations. No visible pneumothorax. Staples seen in the LEFT lung apex.  IMPRESSION: Premature for age atrophy.  No acute intracranial findings.  Cervical spondylosis without fracture or traumatic subluxation.   Electronically Signed   By: Staci Righter M.D.   On: 02/28/2015 16:31   Ct Cervical Spine Wo Contrast  02/28/2015   CLINICAL DATA:  Blurred vision.  Alcohol use.  Inability to walk.  EXAM: CT HEAD WITHOUT CONTRAST  CT CERVICAL SPINE WITHOUT CONTRAST  TECHNIQUE: Multidetector CT imaging of the head and cervical spine was performed following the standard protocol without intravenous contrast. Multiplanar CT image reconstructions of the cervical spine were also generated.  COMPARISON:  CT head and cervical spine 06/28/2007.  FINDINGS: CT HEAD FINDINGS  Premature for age cerebral and cerebellar atrophy. No evidence for acute infarction, hemorrhage, mass lesion, hydrocephalus, or extra-axial fluid.  Calvarium intact. No sinus or mastoid air fluid level. Cerebral volume loss since prior study of 2009.  CT CERVICAL SPINE FINDINGS  There is no visible cervical spine fracture, traumatic subluxation, prevertebral soft tissue swelling, or intraspinal hematoma. Reversal of the normal cervical lordotic curve nuchal ligament calcification. Disc space narrowing at C5-6 and C3-4, with widespread cervical facet disease. No neck masses. Minor atheromatous change at the carotid bifurcations. No  visible pneumothorax. Staples seen in the LEFT lung apex.  IMPRESSION: Premature for age atrophy.  No acute intracranial findings.  Cervical spondylosis without fracture or traumatic subluxation.   Electronically Signed   By: Staci Righter M.D.   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 LEFT nephrectomy for renal cell carcinoma.  EXAM: ULTRASOUND ABDOMEN COMPLETE  COMPARISON:  CT 02/28/2015  FINDINGS: Gallbladder: Gallbladder wall thickening to 4 mm. There is small amount pericholecystic fluid. The gallbladder is mildly distended at 4.6 cm. There is sludge within lumen gallbladder. Sonographic Murphy sign cannot be assessed due to patient sedation.  Common bile duct: Diameter: Proximal common bile duct measures 4 mm. The more distal common bile duct measures 8 mm.  Liver: Heterogeneous liver echotexture. No focal lesion. No duct dilatation.  IVC: No abnormality visualized.  Pancreas: Mild dilatation of the pancreatic duct.  Spleen: Size and appearance within normal limits.  Right Kidney: Length: 11.6. Small amount of perinephric fluid. Echogenicity within normal limits. No mass or hydronephrosis visualized.  Left Kidney: Length: Surgically absent. Echogenicity within normal limits. No mass or hydronephrosis visualized.  Abdominal aorta: No aneurysm visualized.  Other findings: Small volume ascites.  IMPRESSION: 1. Gallbladder wall thickening with sludge and pericholecystic fluid is concerning for acute cholecystitis. Small amount ascites from liver dysfunction  could contribute to the pericholecystic fluid. Consider nuclear medicine HIDA scan if clinical picture is equivocal. 2. Common bile duct is dilated to a greater degree than CT 1 day prior. This may relate to the pancreatitis or choledocholithiasis. 3. Mild prominence the pancreatic duct is similar to comparison CT. These results will be called to the ordering  clinician or representative by the Radiologist Assistant, and communication documented in the PACS or zVision Dashboard.   Electronically Signed   By: Suzy Bouchard M.D.   On: 03/01/2015 12:36   Ct Abdomen Pelvis W Contrast  02/28/2015   CLINICAL DATA:  Abdominal pain, vomiting and generalized weakness. Alcohol intoxication. Fall.  EXAM: CT ABDOMEN AND PELVIS WITH CONTRAST  TECHNIQUE: Multidetector CT imaging of the abdomen and pelvis was performed using the standard protocol following bolus administration of intravenous contrast.  CONTRAST:  182m OMNIPAQUE IOHEXOL 300 MG/ML  SOLN  COMPARISON:  12/22/2007 CT abdomen/pelvis.  FINDINGS: Lower chest: No significant pulmonary nodules or acute consolidative airspace disease.  Hepatobiliary: Diffuse hepatic steatosis. No liver mass. Distended gallbladder containing layering calcified gallstones measuring up to the 5 mm. Top-normal gallbladder wall thickness. No biliary ductal dilatation.  Pancreas: There is prominent fat stranding and ill-defined fluid surrounding the entire length of the mildly thickened pancreas, in keeping with acute pancreatitis. No pancreatic parenchymal calcifications. No pancreatic mass. Top-normal caliber pancreatic duct. No focal measurable pancreatic or peripancreatic fluid collections. No areas of pancreatic parenchymal nonenhancement or gas.  Spleen: Normal size. No mass.  Adrenals/Urinary Tract: Normal adrenals. Status post left nephrectomy, with no mass or fluid collection in the left nephrectomy bed. Normal right kidney, with no right hydronephrosis and no right renal mass. Normal bladder.  Stomach/Bowel: Grossly normal stomach. Normal caliber small bowel with no small bowel wall thickening. Appendix is not discretely visualized. Normal large bowel with no diverticulosis, large bowel wall thickening or pericolonic fat stranding.  Vascular/Lymphatic: Atherosclerotic nonaneurysmal abdominal aorta. Patent portal, splenic, hepatic and  right renal veins. No pathologically enlarged lymph nodes in the abdomen or pelvis.  Reproductive: Mild prostatomegaly with nonspecific internal prostatic calcification.  Other: No pneumoperitoneum, ascites or focal fluid collection.  Musculoskeletal: No aggressive appearing focal osseous lesions.  IMPRESSION: 1. Acute uncomplicated pancreatitis. 2. Cholelithiasis. Distended gallbladder. No specific findings to suggest acute cholecystitis. 3. No biliary ductal dilatation. 4. Diffuse hepatic steatosis. 5. Status post left nephrectomy, with no abnormal findings in the left nephrectomy bed.   Electronically Signed   By: JIlona SorrelM.D.   On: 02/28/2015 19:37   Dg Abd Acute W/chest  02/28/2015   CLINICAL DATA:  Ethanol abuse. Abdominal pain. Generalized weakness.  EXAM: DG ABDOMEN ACUTE W/ 1V CHEST  COMPARISON:  Radiograph 04/30/2008  FINDINGS: Normal cardiac silhouette with ectatic aorta. There is postsurgical change in the LEFT upper lobe along the mediastinum. No effusion, infiltrate, or pneumothorax.  LEFT lower decubitus view demonstrates no intraperitoneal free air. AP view demonstrates no dilated loops of large or small bowel. There is gas the rectum. No pathologic calcifications. No organomegaly.  IMPRESSION: 1.  No acute cardiopulmonary process. 2. No evidence of bowel obstruction or intraperitoneal free air.   Electronically Signed   By: SSuzy BouchardM.D.   On: 02/28/2015 16:45    Problem List: Patient Active Problem List   Diagnosis Date Noted  . Shock liver 03/02/2015  . Melena 03/02/2015  . Abnormal liver function tests 03/01/2015  . Pancreatitis 02/28/2015  . Acute alcoholic hepatitis 065/78/4696 . Acute kidney injury (  [place]) 02/28/2015  . Alcohol abuse 02/28/2015  . Metabolic acidosis 63/81/7711  . Leukocytosis 02/28/2015  . Ketoacidosis 02/28/2015  . Lactic acidosis 02/28/2015    Assessment & Plan: Chart reviewed;  Patient seen and examined.  I would treat this man for alcohol  withdrawal and malnutrition.  He likely has alcoholic hepatitis from his LFTs.  We will follow and likely offer him a cholecystectomy prior to discharge or shortly thereafter.      Matt B. Hassell Done, MD, [facility] [phone] beeper [phone]  03/02/2015 1:44 PM

## 2015-03-02 NOTE — Progress Notes (Addendum)
[facility] Progress Note  Subjective:   Labs improving--AST 1085, ALT 288, t bili 1.0.  WBC 10.8.Hepatitis serologies neg.Acetominophen level <10.Less abd pain. Had several loose dark BM last pm, and nurse reports a formed dark BM this morning. No BRBPR.   Objective:  Vital signs in last 24 hours: Temp:  [97.7 F (36.5 C)-99.3 F (37.4 C)] 98.6 F (37 C) (10/02 0736) Pulse Rate:  [29-103] 87 (10/02 0200) Resp:  [12-18] 17 (10/02 0800) BP: (118-162)/(19-125) 134/85 mmHg (10/02 0800) SpO2:  [97 %-100 %] 98 % (10/02 0200) Weight:  [147 lb 7.8 oz (66.9 kg)] 147 lb 7.8 oz (66.9 kg) (10/01 2306) Last BM Date: 03/02/15 General:   Alert,  Well-developed,  in NAD Heart:  Regular rate and rhythm; no murmurs Pulm;lungs clear Abdomen:  Soft,mild epigastric tenderness to palpation,. Normal bowel sounds, without guarding, and without rebound.   Extremities:  Without edema. Neurologic: Alert and  oriented x4;  grossly normal neurologically. Psych: Alert and cooperative. Normal mood and affect.  Intake/Output from previous day: 10/01 0701 - 10/02 0700 In: 4132.5 [P.O.:2160; I.V.:1972.5] Out: 1225 [Urine:1225] Intake/Output this shift:    Lab Results:  Recent Labs  02/28/15 1700 03/01/15 0110 03/02/15 0351  WBC 22.4* 10.8* 7.8  HGB 13.0 11.2* 9.2*  HCT 37.8* 31.6* 25.4*  PLT 104* 87* 60*   BMET  Recent Labs  02/28/15 1955 03/01/15 0110 03/02/15 0351  NA 135 134* 134*  K 5.3* 4.5 3.4*  CL 99* 101 105  CO2 13* 17* 24  GLUCOSE 111* 106* 141*  BUN 22* 17 6  CREATININE 1.54* 1.38* 0.82  CALCIUM 7.4* 7.0* 7.6*   LFT  Recent Labs  03/02/15 0351  PROT 5.1*  ALBUMIN 2.7*  AST 1085*  ALT 288*  ALKPHOS 130*  BILITOT 1.0   PT/INR  Recent Labs  03/01/15 0110  LABPROT 16.7*  INR 1.34   Hepatitis Panel  Recent Labs  02/28/15 1955 03/01/15 0925  HEPBSAG Negative Negative  HCVAB <0.1  --   HEPAIGM Negative  --   HEPBIGM Negative Negative     Ct Head Wo Contrast  02/28/2015   CLINICAL DATA:  Blurred vision.  Alcohol use.  Inability to walk.  EXAM: CT HEAD WITHOUT CONTRAST  CT CERVICAL SPINE WITHOUT CONTRAST  TECHNIQUE: Multidetector CT imaging of the head and cervical spine was performed following the standard protocol without intravenous contrast. Multiplanar CT image reconstructions of the cervical spine were also generated.  COMPARISON:  CT head and cervical spine 06/28/2007.  FINDINGS: CT HEAD FINDINGS  Premature for age cerebral and cerebellar atrophy. No evidence for acute infarction, hemorrhage, mass lesion, hydrocephalus, or extra-axial fluid.  Calvarium intact. No sinus or mastoid air fluid level. Cerebral volume loss since prior study of 2009.  CT CERVICAL SPINE FINDINGS  There is no visible cervical spine fracture, traumatic subluxation, prevertebral soft tissue swelling, or intraspinal hematoma. Reversal of the normal cervical lordotic curve nuchal ligament calcification. Disc space narrowing at C5-6 and C3-4, with widespread cervical facet disease. No neck masses. Minor atheromatous change at the carotid bifurcations. No visible pneumothorax. Staples seen in the LEFT lung apex.  IMPRESSION: Premature for age atrophy.  No acute intracranial findings.  Cervical spondylosis without fracture or traumatic subluxation.   Electronically Signed   By: Staci Righter M.D.   On: 02/28/2015 16:31   Ct Cervical Spine Wo Contrast  02/28/2015   CLINICAL DATA:  Blurred vision.  Alcohol use.  Inability to walk.  EXAM: CT HEAD WITHOUT CONTRAST  CT CERVICAL SPINE WITHOUT CONTRAST  TECHNIQUE: Multidetector CT imaging of the head and cervical spine was performed following the standard protocol without intravenous contrast. Multiplanar CT image reconstructions of the cervical spine were also generated.  COMPARISON:  CT head and cervical spine 06/28/2007.  FINDINGS: CT HEAD FINDINGS  Premature for age cerebral and cerebellar atrophy. No evidence for acute  infarction, hemorrhage, mass lesion, hydrocephalus, or extra-axial fluid.  Calvarium intact. No sinus or mastoid air fluid level. Cerebral volume loss since prior study of 2009.  CT CERVICAL SPINE FINDINGS  There is no visible cervical spine fracture, traumatic subluxation, prevertebral soft tissue swelling, or intraspinal hematoma. Reversal of the normal cervical lordotic curve nuchal ligament calcification. Disc space narrowing at C5-6 and C3-4, with widespread cervical facet disease. No neck masses. Minor atheromatous change at the carotid bifurcations. No visible pneumothorax. Staples seen in the LEFT lung apex.  IMPRESSION: Premature for age atrophy.  No acute intracranial findings.  Cervical spondylosis without fracture or traumatic subluxation.   Electronically Signed   By: Staci Righter M.D.   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 LEFT nephrectomy for renal cell carcinoma.  EXAM: ULTRASOUND ABDOMEN COMPLETE  COMPARISON:  CT 02/28/2015  FINDINGS: Gallbladder: Gallbladder wall thickening to 4 mm. There is small amount pericholecystic fluid. The gallbladder is mildly distended at 4.6 cm. There is sludge within lumen gallbladder. Sonographic Murphy sign cannot be assessed due to patient sedation.  Common bile duct: Diameter: Proximal common bile duct measures 4 mm. The more distal common bile duct measures 8 mm.  Liver: Heterogeneous liver echotexture. No focal lesion. No duct dilatation.  IVC: No abnormality visualized.  Pancreas: Mild dilatation of the pancreatic duct.  Spleen: Size and appearance within normal limits.  Right Kidney: Length: 11.6. Small amount of perinephric fluid. Echogenicity within normal limits. No mass or hydronephrosis visualized.  Left Kidney: Length: Surgically absent. Echogenicity within normal limits. No mass or hydronephrosis visualized.  Abdominal aorta: No aneurysm visualized.   Other findings: Small volume ascites.  IMPRESSION: 1. Gallbladder wall thickening with sludge and pericholecystic fluid is concerning for acute cholecystitis. Small amount ascites from liver dysfunction could contribute to the pericholecystic fluid. Consider nuclear medicine HIDA scan if clinical picture is equivocal. 2. Common bile duct is dilated to a greater degree than CT 1 day prior. This may relate to the pancreatitis or choledocholithiasis. 3. Mild prominence the pancreatic duct is similar to comparison CT. These results will be called to the ordering clinician or representative by the Radiologist Assistant, and communication documented in the PACS or zVision Dashboard.   Electronically Signed   By: Suzy Bouchard M.D.   On: 03/01/2015 12:36   Ct Abdomen Pelvis W Contrast  02/28/2015   CLINICAL DATA:  Abdominal pain, vomiting and generalized weakness. Alcohol intoxication. Fall.  EXAM: CT ABDOMEN AND PELVIS WITH CONTRAST  TECHNIQUE: Multidetector CT imaging of the abdomen and pelvis was performed using the standard protocol following bolus administration of intravenous contrast.  CONTRAST:  184mL OMNIPAQUE IOHEXOL 300 MG/ML  SOLN  COMPARISON:  12/22/2007 CT abdomen/pelvis.  FINDINGS: Lower chest: No significant pulmonary nodules or acute consolidative airspace disease.  Hepatobiliary: Diffuse hepatic steatosis. No liver mass. Distended gallbladder containing layering calcified gallstones measuring up to the 5 mm. Top-normal gallbladder wall thickness. No biliary ductal dilatation.  Pancreas: There is prominent fat stranding and ill-defined  fluid surrounding the entire length of the mildly thickened pancreas, in keeping with acute pancreatitis. No pancreatic parenchymal calcifications. No pancreatic mass. Top-normal caliber pancreatic duct. No focal measurable pancreatic or peripancreatic fluid collections. No areas of pancreatic parenchymal nonenhancement or gas.  Spleen: Normal size. No mass.   Adrenals/Urinary Tract: Normal adrenals. Status post left nephrectomy, with no mass or fluid collection in the left nephrectomy bed. Normal right kidney, with no right hydronephrosis and no right renal mass. Normal bladder.  Stomach/Bowel: Grossly normal stomach. Normal caliber small bowel with no small bowel wall thickening. Appendix is not discretely visualized. Normal large bowel with no diverticulosis, large bowel wall thickening or pericolonic fat stranding.  Vascular/Lymphatic: Atherosclerotic nonaneurysmal abdominal aorta. Patent portal, splenic, hepatic and right renal veins. No pathologically enlarged lymph nodes in the abdomen or pelvis.  Reproductive: Mild prostatomegaly with nonspecific internal prostatic calcification.  Other: No pneumoperitoneum, ascites or focal fluid collection.  Musculoskeletal: No aggressive appearing focal osseous lesions.  IMPRESSION: 1. Acute uncomplicated pancreatitis. 2. Cholelithiasis. Distended gallbladder. No specific findings to suggest acute cholecystitis. 3. No biliary ductal dilatation. 4. Diffuse hepatic steatosis. 5. Status post left nephrectomy, with no abnormal findings in the left nephrectomy bed.   Electronically Signed   By: Ilona Sorrel M.D.   On: 02/28/2015 19:37   Dg Abd Acute W/chest  02/28/2015   CLINICAL DATA:  Ethanol abuse. Abdominal pain. Generalized weakness.  EXAM: DG ABDOMEN ACUTE W/ 1V CHEST  COMPARISON:  Radiograph 04/30/2008  FINDINGS: Normal cardiac silhouette with ectatic aorta. There is postsurgical change in the LEFT upper lobe along the mediastinum. No effusion, infiltrate, or pneumothorax.  LEFT lower decubitus view demonstrates no intraperitoneal free air. AP view demonstrates no dilated loops of large or small bowel. There is gas the rectum. No pathologic calcifications. No organomegaly.  IMPRESSION: 1.  No acute cardiopulmonary process. 2. No evidence of bowel obstruction or intraperitoneal free air.   Electronically Signed   By:  Suzy Bouchard M.D.   On: 02/28/2015 16:45    ASSESSMENT/PLAN:  59 year old male with a personal history of renal cell carcinoma status post nephrectomy, and a long-standing history of alcohol abuse/substance abuse, admitted with elevated LFTs suggestive of an alcoholic hepatitis, along with pancreatitis, also likely due to his alcoholism. Hepatitis may have an ischemic etiology in part is well as patient has been abusing cocaine.Hepatitis serologies neg--recommend triwnrix vaccine series (can get as out pt).  LFTs improving--likely were elevated due too injury from cocaine and ETOH.Would recheck Hgb today as well. If has drop in Hgb and further melena, may need EGD as inpt.    Alcohol abuse--CIWA protocol. Supportive care. Thrombocytopenia. Likely to to his liver disease secondary to EtOH. INR normal at this time. Pancreatitis--likely secondary to EtOH. Continue analgesia and IV fluids, bowel rest. Heme positive stools.--Patient reported hematemesis Friday.? Gastritis/ulcer/portal gastropathy/varices? Protonix drip. Monitor hemoglobin. May need EGD at some point to assess for gastritis, ulcer, varices, etc. AK I--likely due to dehydration. Creatinine improving with IV fluids.   LOS: 2 days   Hvozdovic, Deloris Ping 03/02/2015, Pager [phone]  GI Attending Note  I have personally taken an interval history, reviewed the chart, and examined the patient.  LFT abnormalities with improvement are consistent with shock liver.  He has had some melana this morning..  Hemoglobin is pending.  We need to carefully define therapeutic goals.  I do not favor aggressive endoscopic therapy unless it is clearly the patient's desire for this if necessary.  Continue Protonix  drip..  Sandy Salaam. Deatra Ina, MD, [facility] [phone]

## 2015-03-02 NOTE — Progress Notes (Signed)
[facility] PROGRESS NOTE  Jeremy [name] DJS:[id number] DOB: 1955/10/26 DOA: 02/28/2015 PCP: No primary care provider on file.  Assessment/Plan:  1.  Liver failure -Jeremy [name] presents with a history of daily heavy alcohol use, having severe abdominal pain associate with nausea and vomiting that would be consistent with alcoholic hepatitis.  -Labs indicate a MELD score of 16 with a Hepatic Discriminate Function of 23.8. I didn't think he would benefit from systemic steroids. -Although alcoholic hepatitis was on the differential, his liver function tests revealed an AST of 3630 with ALT of 572. I think the degree of AST/ALT elevation would seem unusual for an acute alcoholic hepatitis, and I'm concerned for other mechanisms of liver injury. Repeat labs on 03/02/2015 showing downward trend in his enzymes, AST coming down to 1085 and ALT at 288.  -He was further worked up for hepatitis A, hepatitis B, hepatitis C which came back negative. Additionally HIV was negative and tolerable was undetectable.  -Ischemia is a possibility having urine drug screen positive for cocaine -Abdominal ultrasound did reveal the presence of gallbladder wall thickening to 4 mm with presence of sludge within the gallbladder. There was also some common bile duct dilatation, measuring 8 mm in the more distal portion. Their comes the question of gallbladder disease contributing to symptoms. Again it would seem unusual that this could cause such an elevation in his AST/ALT.  -We will start empiric IV antimicrobial therapy with Zosyn and make him nothing by mouth -Case was discussed with Dr. Hassell Done of general surgery who will see patient in consultation.  2.  Alcohol abuse with alcohol withdrawals  -Patient reporting drinking 2/5 of liquor on a daily basis -This morning he had clinical signs symptoms consistent with acute withdrawal, he is at high risk for developing delirium tremens and alcohol withdrawal seizures -Will  continue management with IV Ativan, thiamine and folate, IV fluids, supportive care, close monitoring as stepdown unit.  3.  Acute kidney injury. -This is likely due to profound dehydration as he presented with a creatinine of 1.7 with BUN of 23.  -A.m. labs revealing improvement in his kidney function with draining coming down to 0.82 and BUN 6. -Patient nothing by mouth, will continue IV fluids  4.  Anion gap metabolic acidosis -Initial labs revealed a bicarbonate of 9 with anion gap of 36 -This is likely related to alcoholic ketoacidosis characterized by starvation -A.m. labs showing closing of his gap   5.  Probable alcoholic pancreatitis -He presented with significant abdominal pain having a lipase level of 151 -Repeat lipase this morning -Continue nothing by mouth status, IV fluid resuscitation, as needed narcotic analgesia for pain management.  6.  DVT prophylaxis -SCDs  7. Nutrition -Placing him back on NPO status  Code Status: Full code Family Communication: Family not present Disposition Plan: Will continue close monitoring in the step down unit, case discussed with Dr. Hassell Done of general surgery who will consult.   Consultants:  GI  General surgery  HPI/Subjective: Jeremy [name] is a 59 year old male with a past medical history renal cell carcinoma status post left nephrectomy in 2009, chronic alcoholism, homelessness, admitted to the medicine service on 02/28/2015 when he presented to the emergency department with complaints of abdominal pain associate with nausea/vomiting and generalized weakness. He reports drinking 2/5 of liquor on a daily basis. On admission lab work revealed an AST of 2177 with ALT of 566, bicarbonate of 9 with creatinine of 1.7. He was admitted to the stepdown units and started  on IV fluids. He has required Ativan for signs symptoms of alcohol withdrawal. Based on a.m. lab work he had a meld score of 16 with a hepatic  Objective: Filed Vitals:    03/02/15 0736  BP:   Pulse:   Temp: 98.6 F (37 C)  Resp:     Intake/Output Summary (Last 24 hours) at 03/02/15 0801 Last data filed at 03/02/15 5956  Gross per 24 hour  Intake 3882.5 ml  Output   1225 ml  Net 2657.5 ml   Filed Weights   03/01/15 2306  Weight: 66.9 kg (147 lb 7.8 oz)    Exam:   General:  Disheveled, chronically ill-appearing, mildly confused, tremorous  Cardiovascular: Regular rate and rhythm normal S1-S2 no murmurs or gallops  Respiratory: Normal respiratory effort, lungs overall clear to auscultation bilaterally  Abdomen: Patient having significant pain with palpation across abdomen, particularly over epigastric region.  Musculoskeletal: Sarcopenia  Data Reviewed: Basic Metabolic Panel:  Recent Labs Lab 02/28/15 1700 02/28/15 1955 03/01/15 0110 03/02/15 0351  NA 140 135 134* 134*  K 4.8 5.3* 4.5 3.4*  CL 95* 99* 101 105  CO2 9* 13* 17* 24  GLUCOSE 38* 111* 106* 141*  BUN 23* 22* 17 6  CREATININE 1.71* 1.54* 1.38* 0.82  CALCIUM 8.5* 7.4* 7.0* 7.6*   Liver Function Tests:  Recent Labs Lab 02/28/15 1700 03/01/15 0110 03/02/15 0351  AST 2177* 3630* 1085*  ALT 566* 572* 288*  ALKPHOS 190* 174* 130*  BILITOT 1.3* 2.2* 1.0  PROT 7.7 5.5* 5.1*  ALBUMIN 4.2 3.1* 2.7*    Recent Labs Lab 02/28/15 1700  LIPASE 151*   No results for input(s): AMMONIA in the last 168 hours. CBC:  Recent Labs Lab 02/28/15 1700 03/01/15 0110 03/02/15 0351  WBC 22.4* 10.8* 7.8  NEUTROABS 19.3*  --   --   HGB 13.0 11.2* 9.2*  HCT 37.8* 31.6* 25.4*  MCV 98.2 95.2 94.4  PLT 104* 87* 60*   Cardiac Enzymes: No results for input(s): CKTOTAL, CKMB, CKMBINDEX, TROPONINI in the last 168 hours. BNP (last 3 results) No results for input(s): BNP in the last 8760 hours.  ProBNP (last 3 results) No results for input(s): PROBNP in the last 8760 hours.  CBG:  Recent Labs Lab 02/28/15 1805 02/28/15 1835 02/28/15 2142 03/01/15 0745  GLUCAP 25* 153*  144* 128*    Recent Results (from the past 240 hour(s))  MRSA PCR Screening     Status: None   Collection Time: 02/28/15 11:31 PM  Result Value Ref Range Status   MRSA by PCR NEGATIVE NEGATIVE Final    Comment:        The GeneXpert MRSA Assay (FDA approved for NASAL specimens only), is one component of a comprehensive MRSA colonization surveillance program. It is not intended to diagnose MRSA infection nor to guide or monitor treatment for MRSA infections.   C difficile quick scan w PCR reflex     Status: Abnormal   Collection Time: 03/01/15  3:30 PM  Result Value Ref Range Status   C Diff antigen POSITIVE (A) NEGATIVE Final   C Diff toxin NEGATIVE NEGATIVE Final   C Diff interpretation   Final    C. difficile present, but toxin not detected. This indicates colonization. In most cases, this does not require treatment. If patient has signs and symptoms consistent with colitis, consider treatment. Requires ENTERIC precautions.     Studies: Ct Head Wo Contrast  02/28/2015   CLINICAL DATA:  Blurred vision.  Alcohol use.  Inability to walk.  EXAM: CT HEAD WITHOUT CONTRAST  CT CERVICAL SPINE WITHOUT CONTRAST  TECHNIQUE: Multidetector CT imaging of the head and cervical spine was performed following the standard protocol without intravenous contrast. Multiplanar CT image reconstructions of the cervical spine were also generated.  COMPARISON:  CT head and cervical spine 06/28/2007.  FINDINGS: CT HEAD FINDINGS  Premature for age cerebral and cerebellar atrophy. No evidence for acute infarction, hemorrhage, mass lesion, hydrocephalus, or extra-axial fluid.  Calvarium intact. No sinus or mastoid air fluid level. Cerebral volume loss since prior study of 2009.  CT CERVICAL SPINE FINDINGS  There is no visible cervical spine fracture, traumatic subluxation, prevertebral soft tissue swelling, or intraspinal hematoma. Reversal of the normal cervical lordotic curve nuchal ligament calcification. Disc  space narrowing at C5-6 and C3-4, with widespread cervical facet disease. No neck masses. Minor atheromatous change at the carotid bifurcations. No visible pneumothorax. Staples seen in the LEFT lung apex.  IMPRESSION: Premature for age atrophy.  No acute intracranial findings.  Cervical spondylosis without fracture or traumatic subluxation.   Electronically Signed   By: Staci Righter M.D.   On: 02/28/2015 16:31   Ct Cervical Spine Wo Contrast  02/28/2015   CLINICAL DATA:  Blurred vision.  Alcohol use.  Inability to walk.  EXAM: CT HEAD WITHOUT CONTRAST  CT CERVICAL SPINE WITHOUT CONTRAST  TECHNIQUE: Multidetector CT imaging of the head and cervical spine was performed following the standard protocol without intravenous contrast. Multiplanar CT image reconstructions of the cervical spine were also generated.  COMPARISON:  CT head and cervical spine 06/28/2007.  FINDINGS: CT HEAD FINDINGS  Premature for age cerebral and cerebellar atrophy. No evidence for acute infarction, hemorrhage, mass lesion, hydrocephalus, or extra-axial fluid.  Calvarium intact. No sinus or mastoid air fluid level. Cerebral volume loss since prior study of 2009.  CT CERVICAL SPINE FINDINGS  There is no visible cervical spine fracture, traumatic subluxation, prevertebral soft tissue swelling, or intraspinal hematoma. Reversal of the normal cervical lordotic curve nuchal ligament calcification. Disc space narrowing at C5-6 and C3-4, with widespread cervical facet disease. No neck masses. Minor atheromatous change at the carotid bifurcations. No visible pneumothorax. Staples seen in the LEFT lung apex.  IMPRESSION: Premature for age atrophy.  No acute intracranial findings.  Cervical spondylosis without fracture or traumatic subluxation.   Electronically Signed   By: Staci Righter M.D.   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 LEFT nephrectomy  for renal cell carcinoma.  EXAM: ULTRASOUND ABDOMEN COMPLETE  COMPARISON:  CT 02/28/2015  FINDINGS: Gallbladder: Gallbladder wall thickening to 4 mm. There is small amount pericholecystic fluid. The gallbladder is mildly distended at 4.6 cm. There is sludge within lumen gallbladder. Sonographic Murphy sign cannot be assessed due to patient sedation.  Common bile duct: Diameter: Proximal common bile duct measures 4 mm. The more distal common bile duct measures 8 mm.  Liver: Heterogeneous liver echotexture. No focal lesion. No duct dilatation.  IVC: No abnormality visualized.  Pancreas: Mild dilatation of the pancreatic duct.  Spleen: Size and appearance within normal limits.  Right Kidney: Length: 11.6. Small amount of perinephric fluid. Echogenicity within normal limits. No mass or hydronephrosis visualized.  Left Kidney: Length: Surgically absent. Echogenicity within normal limits. No mass or hydronephrosis visualized.  Abdominal aorta: No aneurysm visualized.  Other findings: Small volume ascites.  IMPRESSION: 1. Gallbladder wall thickening with  sludge and pericholecystic fluid is concerning for acute cholecystitis. Small amount ascites from liver dysfunction could contribute to the pericholecystic fluid. Consider nuclear medicine HIDA scan if clinical picture is equivocal. 2. Common bile duct is dilated to a greater degree than CT 1 day prior. This may relate to the pancreatitis or choledocholithiasis. 3. Mild prominence the pancreatic duct is similar to comparison CT. These results will be called to the ordering clinician or representative by the Radiologist Assistant, and communication documented in the PACS or zVision Dashboard.   Electronically Signed   By: Suzy Bouchard M.D.   On: 03/01/2015 12:36   Ct Abdomen Pelvis W Contrast  02/28/2015   CLINICAL DATA:  Abdominal pain, vomiting and generalized weakness. Alcohol intoxication. Fall.  EXAM: CT ABDOMEN AND PELVIS WITH CONTRAST  TECHNIQUE: Multidetector  CT imaging of the abdomen and pelvis was performed using the standard protocol following bolus administration of intravenous contrast.  CONTRAST:  155mL OMNIPAQUE IOHEXOL 300 MG/ML  SOLN  COMPARISON:  12/22/2007 CT abdomen/pelvis.  FINDINGS: Lower chest: No significant pulmonary nodules or acute consolidative airspace disease.  Hepatobiliary: Diffuse hepatic steatosis. No liver mass. Distended gallbladder containing layering calcified gallstones measuring up to the 5 mm. Top-normal gallbladder wall thickness. No biliary ductal dilatation.  Pancreas: There is prominent fat stranding and ill-defined fluid surrounding the entire length of the mildly thickened pancreas, in keeping with acute pancreatitis. No pancreatic parenchymal calcifications. No pancreatic mass. Top-normal caliber pancreatic duct. No focal measurable pancreatic or peripancreatic fluid collections. No areas of pancreatic parenchymal nonenhancement or gas.  Spleen: Normal size. No mass.  Adrenals/Urinary Tract: Normal adrenals. Status post left nephrectomy, with no mass or fluid collection in the left nephrectomy bed. Normal right kidney, with no right hydronephrosis and no right renal mass. Normal bladder.  Stomach/Bowel: Grossly normal stomach. Normal caliber small bowel with no small bowel wall thickening. Appendix is not discretely visualized. Normal large bowel with no diverticulosis, large bowel wall thickening or pericolonic fat stranding.  Vascular/Lymphatic: Atherosclerotic nonaneurysmal abdominal aorta. Patent portal, splenic, hepatic and right renal veins. No pathologically enlarged lymph nodes in the abdomen or pelvis.  Reproductive: Mild prostatomegaly with nonspecific internal prostatic calcification.  Other: No pneumoperitoneum, ascites or focal fluid collection.  Musculoskeletal: No aggressive appearing focal osseous lesions.  IMPRESSION: 1. Acute uncomplicated pancreatitis. 2. Cholelithiasis. Distended gallbladder. No specific  findings to suggest acute cholecystitis. 3. No biliary ductal dilatation. 4. Diffuse hepatic steatosis. 5. Status post left nephrectomy, with no abnormal findings in the left nephrectomy bed.   Electronically Signed   By: Ilona Sorrel M.D.   On: 02/28/2015 19:37   Dg Abd Acute W/chest  02/28/2015   CLINICAL DATA:  Ethanol abuse. Abdominal pain. Generalized weakness.  EXAM: DG ABDOMEN ACUTE W/ 1V CHEST  COMPARISON:  Radiograph 04/30/2008  FINDINGS: Normal cardiac silhouette with ectatic aorta. There is postsurgical change in the LEFT upper lobe along the mediastinum. No effusion, infiltrate, or pneumothorax.  LEFT lower decubitus view demonstrates no intraperitoneal free air. AP view demonstrates no dilated loops of large or small bowel. There is gas the rectum. No pathologic calcifications. No organomegaly.  IMPRESSION: 1.  No acute cardiopulmonary process. 2. No evidence of bowel obstruction or intraperitoneal free air.   Electronically Signed   By: Suzy Bouchard M.D.   On: 02/28/2015 16:45    Scheduled Meds: . antiseptic oral rinse  7 mL Mouth Rinse q12n4p  . chlorhexidine  15 mL Mouth Rinse BID  . cloNIDine  0.1 mg  Oral BID  . docusate sodium  100 mg Oral BID  . folic acid  1 mg Oral Daily  . multivitamin with minerals  1 tablet Oral Daily  . potassium chloride  40 mEq Oral Once  . sodium chloride  3 mL Intravenous Q12H  . thiamine  100 mg Oral Daily   Or  . thiamine  100 mg Intravenous Daily   Continuous Infusions: . sodium chloride 150 mL/hr at 03/02/15 0607  . pantoprozole (PROTONIX) infusion 8 mg/hr (03/02/15 0603)    Principal Problem:   Pancreatitis Active Problems:   Acute alcoholic hepatitis   Acute kidney injury ([place])   Alcohol abuse   Metabolic acidosis   Leukocytosis   Ketoacidosis   Lactic acidosis   Abnormal liver function tests    Time spent: 40 min    Jeremy [name]  [facility] Pager [phone]. If 7PM-7AM, please contact night-coverage at  [email or web address], password [facility] 03/02/2015, 8:01 AM  LOS: 2 days

## 2015-03-03 DIAGNOSIS — K8500 Idiopathic acute pancreatitis without necrosis or infection: Secondary | ICD-10-CM

## 2015-03-03 DIAGNOSIS — E43 Unspecified severe protein-calorie malnutrition: Secondary | ICD-10-CM | POA: Insufficient documentation

## 2015-03-03 LAB — GLUCOSE, CAPILLARY: Glucose-Capillary: 80 mg/dL (ref 65–99)

## 2015-03-03 LAB — COMPREHENSIVE METABOLIC PANEL
ALT: 202 U/L — AB (ref 17–63)
ANION GAP: 6 (ref 5–15)
AST: 409 U/L — ABNORMAL HIGH (ref 15–41)
Albumin: 2.8 g/dL — ABNORMAL LOW (ref 3.5–5.0)
Alkaline Phosphatase: 119 U/L (ref 38–126)
BUN: <5 mg/dL — ABNORMAL LOW (ref 6–20)
CHLORIDE: 106 mmol/L (ref 101–111)
CO2: 25 mmol/L (ref 22–32)
Calcium: 7.9 mg/dL — ABNORMAL LOW (ref 8.9–10.3)
Creatinine, Ser: 0.76 mg/dL (ref 0.61–1.24)
Glucose, Bld: 92 mg/dL (ref 65–99)
POTASSIUM: 3 mmol/L — AB (ref 3.5–5.1)
SODIUM: 137 mmol/L (ref 135–145)
Total Bilirubin: 0.8 mg/dL (ref 0.3–1.2)
Total Protein: 5.4 g/dL — ABNORMAL LOW (ref 6.5–8.1)

## 2015-03-03 LAB — CBC
HEMATOCRIT: 25.8 % — AB (ref 39.0–52.0)
HEMOGLOBIN: 9.1 g/dL — AB (ref 13.0–17.0)
MCH: 33.7 pg (ref 26.0–34.0)
MCHC: 35.3 g/dL (ref 30.0–36.0)
MCV: 95.6 fL (ref 78.0–100.0)
PLATELETS: 51 10*3/uL — AB (ref 150–400)
RBC: 2.7 MIL/uL — AB (ref 4.22–5.81)
RDW: 14.9 % (ref 11.5–15.5)
WBC: 7.6 10*3/uL (ref 4.0–10.5)

## 2015-03-03 LAB — LIPASE, BLOOD: Lipase: 197 U/L — ABNORMAL HIGH (ref 22–51)

## 2015-03-03 LAB — HEPATITIS B E ANTIBODY: Hep B E Ab: NEGATIVE

## 2015-03-03 MED ORDER — POTASSIUM CHLORIDE CRYS ER 20 MEQ PO TBCR
40.0000 meq | EXTENDED_RELEASE_TABLET | Freq: Four times a day (QID) | ORAL | Status: AC
  Administered 2015-03-03 (×3): 40 meq via ORAL
  Filled 2015-03-03 (×3): qty 2

## 2015-03-03 MED ORDER — DIPHENHYDRAMINE HCL 50 MG/ML IJ SOLN
12.5000 mg | Freq: Four times a day (QID) | INTRAMUSCULAR | Status: DC | PRN
  Administered 2015-03-03 – 2015-03-05 (×3): 12.5 mg via INTRAVENOUS
  Filled 2015-03-03 (×3): qty 1

## 2015-03-03 MED ORDER — SODIUM CHLORIDE 0.9 % IV SOLN
INTRAVENOUS | Status: DC
  Administered 2015-03-03: 15:00:00 via INTRAVENOUS
  Administered 2015-03-03: 20 mL/h via INTRAVENOUS

## 2015-03-03 MED ORDER — LORAZEPAM 2 MG/ML IJ SOLN
1.0000 mg | Freq: Once | INTRAMUSCULAR | Status: AC
  Administered 2015-03-03: 1 mg via INTRAVENOUS
  Filled 2015-03-03: qty 1

## 2015-03-03 NOTE — Progress Notes (Signed)
[facility] Progress Note  Subjective:  Feels ok.  Still has some abdominal pain.  Somewhat sleepy from ativan. Would like to try clear liquids since he "hasn't had anything in stomach for 5 or 6 days".  Objective:  Vital signs in last 24 hours: Temp:  [97.8 F (36.6 C)-98.7 F (37.1 C)] 98.4 F (36.9 C) (10/03 0730) Pulse Rate:  [64-98] 76 (10/03 0100) Resp:  [11-18] 12 (10/03 0800) BP: (130-167)/(70-128) 145/84 mmHg (10/03 0800) SpO2:  [54 %-100 %] 100 % (10/03 0400) Last BM Date: 03/02/15 General:  Alert, Well-developed, in NAD; resting comfortable Heart:  Regular rate and rhythm; no murmurs Pulm:  CTAB.  No W/R/R. Abdomen:  Soft, non-distended. BS present.  Diffuse TTP > in the epigastrium. Extremities:  Without edema. Neurologic:  Alert and oriented x 4;  grossly normal neurologically.  Intake/Output from previous day: 10/02 0701 - 10/03 0700 In: 4903.3 [P.O.:1820; I.V.:2933.3; IV Piggyback:150] Out: 5400 [Urine:5400] Intake/Output this shift: Total I/O In: 240 [P.O.:240] Out: 400 [Urine:400]  Lab Results:  Recent Labs  03/02/15 0351 03/02/15 1209 03/03/15 0358  WBC 7.8 7.4 7.6  HGB 9.2* 9.1* 9.1*  HCT 25.4* 25.9* 25.8*  PLT 60* 51* 51*   BMET  Recent Labs  03/02/15 0351 03/02/15 1209 03/03/15 0358  NA 134* 135 137  K 3.4* 3.4* 3.0*  CL 105 106 106  CO2 24 25 25   GLUCOSE 141* 113* 92  BUN 6 <5* <5*  CREATININE 0.82 0.80 0.76  CALCIUM 7.6* 7.6* 7.9*   LFT  Recent Labs  03/03/15 0358  PROT 5.4*  ALBUMIN 2.8*  AST 409*  ALT 202*  ALKPHOS 119  BILITOT 0.8   PT/INR  Recent Labs  03/01/15 0110 03/02/15 1209  LABPROT 16.7* 13.8  INR 1.34 1.04   Hepatitis Panel  Recent Labs  02/28/15 1955 03/01/15 0925  HEPBSAG Negative Negative  HCVAB <0.1  --   HEPAIGM Negative  --   HEPBIGM Negative Negative    US Abdomen Complete  03/01/2015   CLINICAL DATA:  Alcoholic liver failure. Additional history of passive  carcinoma. LEFT nephrectomy for renal cell carcinoma.  EXAM: ULTRASOUND ABDOMEN COMPLETE  COMPARISON:  CT 02/28/2015  FINDINGS: Gallbladder: Gallbladder wall thickening to 4 mm. There is small amount pericholecystic fluid. The gallbladder is mildly distended at 4.6 cm. There is sludge within lumen gallbladder. Sonographic Murphy sign cannot be assessed due to patient sedation.  Common bile duct: Diameter: Proximal common bile duct measures 4 mm. The more distal common bile duct measures 8 mm.  Liver: Heterogeneous liver echotexture. No focal lesion. No duct dilatation.  IVC: No abnormality visualized.  Pancreas: Mild dilatation of the pancreatic duct.  Spleen: Size and appearance within normal limits.  Right Kidney: Length: 11.6. Small amount of perinephric fluid. Echogenicity within normal limits. No mass or hydronephrosis visualized.  Left Kidney: Length: Surgically absent. Echogenicity within normal limits. No mass or hydronephrosis visualized.  Abdominal aorta: No aneurysm visualized.  Other findings: Small volume ascites.  IMPRESSION: 1. Gallbladder wall thickening with sludge and pericholecystic fluid is concerning for acute cholecystitis. Small amount ascites from liver dysfunction could contribute to the pericholecystic fluid. Consider nuclear medicine HIDA scan if clinical picture is equivocal. 2. Common bile duct is dilated to a greater degree than CT 1 day prior. This may relate to the pancreatitis or choledocholithiasis. 3. Mild prominence the pancreatic duct is similar to comparison CT. These results will be called to the ordering clinician or representative  by the Radiologist Assistant, and communication documented in the PACS or zVision Dashboard.   Electronically Signed   By: Suzy Bouchard M.D.   On: 03/01/2015 12:36   Assessment / Plan: *59 year old male with a personal history of renal cell carcinoma status post nephrectomy and a long-standing history of alcohol abuse/substance abuse.   Admitted with elevated LFTs along with pancreatitis likely due to his alcoholism.  Hepatitis/transaminitis may have an ischemic etiology/shock liver as patient had been abusing cocaine.  Hepatitis serologies neg--recommend triwnrix vaccine series (can get as out pt). LFTs improving.   *Heme positive/dark stools--Patient reported hematemesis Friday.  ? Gastritis/ulcer/portal gastropathy/varices. *Acute drop in Hgb:  Likely combination of dilution and blood loss.  Hgb stable today.  Plan for EGD with MAC sedation 10/4. *Alcohol abuse--CIWA protocol. Supportive care. *Thrombocytopenia:  Likely to to his liver disease secondary to EtOH. INR normal at this time. *Pancreatitis--likely secondary to EtOH. Continue analgesia and IV fluids, bowel rest.  Lipase improving. *AKI--likely due to dehydration. Creatinine improved with IV fluids.  -Ok for clear liquids today and NPO after midnight for EGD 10/4.    LOS: 3 days   ZEHR, JESSICA D.  03/03/2015, 9:06 AM  Pager number [phone]     Attending physician's note   I have taken an interval history, reviewed the chart and examined the patient. I agree with the Advanced Practitioner's note, impression and recommendations.   Lucio Edward, MD [facility] [phone] Mon-Fri 8a-5p [phone] after 5p, weekends, holidays

## 2015-03-03 NOTE — Progress Notes (Signed)
Patient ID: Jeremy [name], male   DOB: [birth year], 59 y.o.   MRN: [id number]    Subjective: Pt is very sedated secondary to ativan from agitation overnight secondary to ETOH withdrawal   Objective: Vital signs in last 24 hours: Temp:  [97.8 F (36.6 C)-98.7 F (37.1 C)] 98.4 F (36.9 C) (10/03 0400) Pulse Rate:  [64-98] 76 (10/03 0100) Resp:  [11-18] 11 (10/03 0600) BP: (130-167)/(70-128) 142/80 mmHg (10/03 0600) SpO2:  [54 %-100 %] 100 % (10/03 0400) Last BM Date: 03/02/15  Intake/Output from previous day: 10/02 0701 - 10/03 0700 In: 4903.3 [P.O.:1820; I.V.:2933.3; IV Piggyback:150] Out: 5400 [Urine:5400] Intake/Output this shift:    PE: Abd: soft, diffusely tender, but overall benign, no peritoneal signs or rebounding, +BS, ND Heart: regular  Lab Results:   Recent Labs  03/02/15 1209 03/03/15 0358  WBC 7.4 7.6  HGB 9.1* 9.1*  HCT 25.9* 25.8*  PLT 51* 51*   BMET  Recent Labs  03/02/15 1209 03/03/15 0358  NA 135 137  K 3.4* 3.0*  CL 106 106  CO2 25 25  GLUCOSE 113* 92  BUN <5* <5*  CREATININE 0.80 0.76  CALCIUM 7.6* 7.9*   PT/INR  Recent Labs  03/01/15 0110 03/02/15 1209  LABPROT 16.7* 13.8  INR 1.34 1.04   CMP     Component Value Date/Time   NA 137 03/03/2015 0358   K 3.0* 03/03/2015 0358   CL 106 03/03/2015 0358   CO2 25 03/03/2015 0358   GLUCOSE 92 03/03/2015 0358   BUN <5* 03/03/2015 0358   CREATININE 0.76 03/03/2015 0358   CALCIUM 7.9* 03/03/2015 0358   PROT 5.4* 03/03/2015 0358   ALBUMIN 2.8* 03/03/2015 0358   AST 409* 03/03/2015 0358   ALT 202* 03/03/2015 0358   ALKPHOS 119 03/03/2015 0358   BILITOT 0.8 03/03/2015 0358   GFRNONAA >60 03/03/2015 0358   GFRAA >60 03/03/2015 0358   Lipase     Component Value Date/Time   LIPASE 392* 03/02/2015 0351       Studies/Results: US Abdomen Complete  03/01/2015   CLINICAL DATA:  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 LEFT nephrectomy for renal cell  carcinoma.  EXAM: ULTRASOUND ABDOMEN COMPLETE  COMPARISON:  CT 02/28/2015  FINDINGS: Gallbladder: Gallbladder wall thickening to 4 mm. There is small amount pericholecystic fluid. The gallbladder is mildly distended at 4.6 cm. There is sludge within lumen gallbladder. Sonographic Murphy sign cannot be assessed due to patient sedation.  Common bile duct: Diameter: Proximal common bile duct measures 4 mm. The more distal common bile duct measures 8 mm.  Liver: Heterogeneous liver echotexture. No focal lesion. No duct dilatation.  IVC: No abnormality visualized.  Pancreas: Mild dilatation of the pancreatic duct.  Spleen: Size and appearance within normal limits.  Right Kidney: Length: 11.6. Small amount of perinephric fluid. Echogenicity within normal limits. No mass or hydronephrosis visualized.  Left Kidney: Length: Surgically absent. Echogenicity within normal limits. No mass or hydronephrosis visualized.  Abdominal aorta: No aneurysm visualized.  Other findings: Small volume ascites.  IMPRESSION: 1. Gallbladder wall thickening with sludge and pericholecystic fluid is concerning for acute cholecystitis. Small amount ascites from liver dysfunction could contribute to the pericholecystic fluid. Consider nuclear medicine HIDA scan if clinical picture is equivocal. 2. Common bile duct is dilated to a greater degree than CT 1 day prior. This may relate to the pancreatitis or choledocholithiasis. 3. Mild prominence the pancreatic duct is similar to comparison CT. These results will be called  to the ordering clinician or representative by the Radiologist Assistant, and communication documented in the PACS or zVision Dashboard.   Electronically Signed   By: Suzy Bouchard M.D.   On: 03/01/2015 12:36    Anti-infectives: Anti-infectives    Start     Dose/Rate Route Frequency Ordered Stop   03/02/15 1000  piperacillin-tazobactam (ZOSYN) IVPB 3.375 g     3.375 g 12.5 mL/hr over 240 Minutes Intravenous Every 8 hours  03/02/15 0810         Assessment/Plan  1. Elevated LFTs, ? Alcoholic hepatitis or some component of shock liver 2. ETOH pancreatitis 3. Thrombocytopenia  4. Gallbladder sludge 5. ETOH abuse, half gallon of spirits daily 6. H/o kidney cancer  Plan: 1. The patient's LFTs and lipase are trending down nicely.  The patient has multiple problems going on, such as some type of insult to his liver whether that be ETOH hepatitis with some component of shock liver, along with likely some chronic insult from heavy ETOH abuse.  This inflammatory response will typically give the gallbladder a false appearance of cholecystitis with some wall thickening and pericholecystic fluid.  He also has pancreatitis which will contribute to this inflammation as well.  He does not have a WBC and no definitive stones, just sludge.  Given his multitude of problems, we would not recommend a cholecystectomy this admission as this is not likely the source of his current problems.  His platelets are also 51K and dropping.  He would likely not tolerate an operation very well for multiple reasons.  I have discussed this with the primary service who is in agreement.  The patient can follow up with [place] as an outpatient as needed.  We will sign off.      LOS: 3 days    Ryenne Lynam E 03/03/2015, 7:37 AM Pager: [phone]

## 2015-03-03 NOTE — Progress Notes (Signed)
[facility] PROGRESS NOTE  Jeremy [name] YYT:[id number] DOB: [birth year] DOA: 02/28/2015 PCP: No primary care provider on file.  Assessment/Plan:  1.  Liver failure -Jeremy [name] presents with a history of daily heavy alcohol use, having severe abdominal pain associate with nausea and vomiting that would be consistent with alcoholic hepatitis.  -Labs indicate a MELD score of 16 with a Hepatic Discriminate Function of 23.8. I didn't think he would benefit from systemic steroids. -Although alcoholic hepatitis was on the differential, his liver function tests revealed an AST of 3630 with ALT of 572. I think the degree of AST/ALT elevation would seem unusual for an acute alcoholic hepatitis, and I'm concerned for other mechanisms of liver injury. Repeat labs on 03/02/2015 showing downward trend in his enzymes, AST coming down to 1085 and ALT at 288.  -He was further worked up for hepatitis A, hepatitis B, hepatitis C which came back negative. Additionally HIV was negative and tolerable was undetectable.  -Ischemia is a possibility having urine drug screen positive for cocaine -Abdominal ultrasound did reveal the presence of gallbladder wall thickening to 4 mm with presence of sludge within the gallbladder. There was also some common bile duct dilatation, measuring 8 mm in the more distal portion.   -I spoke with surgery who did not recommend cholecystectomy at this time given multiple acute issues. Furthermore it is possible that gallbladder changes may have resulted from hepatitis. They did not recommend continuing antibiotic therapy.  -A.m. labs on 03/03/2015 showing downward trend in AST/ALT to 409 and a 202 respectively.  2.  Alcohol abuse with alcohol withdrawals  -Patient reporting drinking 2/5 of liquor on a daily basis -This morning he had clinical signs symptoms consistent with acute withdrawal, he is at high risk for developing delirium tremens and alcohol withdrawal seizures -Will  continue management with IV Ativan, thiamine and folate, IV fluids, supportive care, close monitoring as stepdown unit.  3.  Acute kidney injury. -This is likely due to profound dehydration as he presented with a creatinine of 1.7 with BUN of 23.  -A.m. labs revealing improvement in his kidney function with IV fluid resuscitation  4.  Anion gap metabolic acidosis -Initial labs revealed a bicarbonate of 9 with anion gap of 36 -This is likely related to alcoholic ketoacidosis characterized by starvation -A.m. labs showing closing of his gap   5.  Probable alcoholic pancreatitis -He presented with significant abdominal pain having a lipase level of 151 -Repeat lipase increased at 392 on 03/02/2015. -On 03/03/2015 lipase trending down to 197, will keep him nothing by mouth with ice chips -Patient unhappy with nothing by mouth status, I explained to him why this was part of the management of acute pancreatitis.    6.  Hypokalemia. -A.m. lab work showing potassium of 3.0, will replace with Kdur 40 meq PO x 3.  -Follow-up on a.m. lab work  7.  DVT prophylaxis -SCDs  8. Nutrition -Nothing by mouth with ice chips  Code Status: Full code Family Communication: Family not present Disposition Plan: Plan to transfer to med/surg   Consultants:  GI  General surgery  HPI/Subjective: Jeremy [name] is a 59 year old male with a past medical history renal cell carcinoma status post left nephrectomy in 2009, chronic alcoholism, homelessness, admitted to the medicine service on 02/28/2015 when he presented to the emergency department with complaints of abdominal pain associate with nausea/vomiting and generalized weakness. He reports drinking 2/5 of liquor on a daily basis. On admission lab work revealed an AST  of 2177 with ALT of 566, bicarbonate of 9 with creatinine of 1.7. He was admitted to the stepdown units and started on IV fluids. He has required Ativan for signs symptoms of alcohol withdrawal.  Based on a.m. lab work he had a meld score of 16 with a hepatic  Objective: Filed Vitals:   03/03/15 0600  BP: 142/80  Pulse:   Temp:   Resp: 11    Intake/Output Summary (Last 24 hours) at 03/03/15 0809 Last data filed at 03/03/15 0600  Gross per 24 hour  Intake 4728.33 ml  Output   5400 ml  Net -671.67 ml   Filed Weights   03/01/15 2306  Weight: 66.9 kg (147 lb 7.8 oz)    Exam:   General:  Disheveled, chronically ill-appearing, sleeping  Cardiovascular: Regular rate and rhythm normal S1-S2 no murmurs or gallops  Respiratory: Normal respiratory effort, lungs overall clear to auscultation bilaterally  Abdomen: Patient's abdominal exam improved, overall having less pain with palpation particularly over epigastric and right upper quadrant regions.  Musculoskeletal: Sarcopenia  Data Reviewed: Basic Metabolic Panel:  Recent Labs Lab 02/28/15 1955 03/01/15 0110 03/02/15 0351 03/02/15 1209 03/03/15 0358  NA 135 134* 134* 135 137  K 5.3* 4.5 3.4* 3.4* 3.0*  CL 99* 101 105 106 106  CO2 13* 17* 24 25 25   GLUCOSE 111* 106* 141* 113* 92  BUN 22* 17 6 <5* <5*  CREATININE 1.54* 1.38* 0.82 0.80 0.76  CALCIUM 7.4* 7.0* 7.6* 7.6* 7.9*   Liver Function Tests:  Recent Labs Lab 02/28/15 1700 03/01/15 0110 03/02/15 0351 03/02/15 1209 03/03/15 0358  AST 2177* 3630* 1085* 718* 409*  ALT 566* 572* 288* 250* 202*  ALKPHOS 190* 174* 130* 126 119  BILITOT 1.3* 2.2* 1.0 0.9 0.8  PROT 7.7 5.5* 5.1* 5.0* 5.4*  ALBUMIN 4.2 3.1* 2.7* 2.7* 2.8*    Recent Labs Lab 02/28/15 1700 03/02/15 0351 03/03/15 0358  LIPASE 151* 392* 197*   No results for input(s): AMMONIA in the last 168 hours. CBC:  Recent Labs Lab 02/28/15 1700 03/01/15 0110 03/02/15 0351 03/02/15 1209 03/03/15 0358  WBC 22.4* 10.8* 7.8 7.4 7.6  NEUTROABS 19.3*  --   --   --   --   HGB 13.0 11.2* 9.2* 9.1* 9.1*  HCT 37.8* 31.6* 25.4* 25.9* 25.8*  MCV 98.2 95.2 94.4 94.2 95.6  PLT 104* 87* 60* 51*  51*   Cardiac Enzymes: No results for input(s): CKTOTAL, CKMB, CKMBINDEX, TROPONINI in the last 168 hours. BNP (last 3 results) No results for input(s): BNP in the last 8760 hours.  ProBNP (last 3 results) No results for input(s): PROBNP in the last 8760 hours.  CBG:  Recent Labs Lab 02/28/15 1835 02/28/15 2142 03/01/15 0745 03/02/15 0746 03/03/15 0758  GLUCAP 153* 144* 128* 153* 80    Recent Results (from the past 240 hour(s))  MRSA PCR Screening     Status: None   Collection Time: 02/28/15 11:31 PM  Result Value Ref Range Status   MRSA by PCR NEGATIVE NEGATIVE Final    Comment:        The GeneXpert MRSA Assay (FDA approved for NASAL specimens only), is one component of a comprehensive MRSA colonization surveillance program. It is not intended to diagnose MRSA infection nor to guide or monitor treatment for MRSA infections.   C difficile quick scan w PCR reflex     Status: Abnormal   Collection Time: 03/01/15  3:30 PM  Result Value Ref Range Status  C Diff antigen POSITIVE (A) NEGATIVE Final   C Diff toxin NEGATIVE NEGATIVE Final   C Diff interpretation   Final    C. difficile present, but toxin not detected. This indicates colonization. In most cases, this does not require treatment. If patient has signs and symptoms consistent with colitis, consider treatment. Requires ENTERIC precautions.     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 LEFT nephrectomy for renal cell carcinoma.  EXAM: ULTRASOUND ABDOMEN COMPLETE  COMPARISON:  CT 02/28/2015  FINDINGS: Gallbladder: Gallbladder wall thickening to 4 mm. There is small amount pericholecystic fluid. The gallbladder is mildly distended at 4.6 cm. There is sludge within lumen gallbladder. Sonographic Murphy sign cannot be assessed due to patient sedation.  Common bile duct: Diameter: Proximal common bile duct measures 4 mm. The more distal  common bile duct measures 8 mm.  Liver: Heterogeneous liver echotexture. No focal lesion. No duct dilatation.  IVC: No abnormality visualized.  Pancreas: Mild dilatation of the pancreatic duct.  Spleen: Size and appearance within normal limits.  Right Kidney: Length: 11.6. Small amount of perinephric fluid. Echogenicity within normal limits. No mass or hydronephrosis visualized.  Left Kidney: Length: Surgically absent. Echogenicity within normal limits. No mass or hydronephrosis visualized.  Abdominal aorta: No aneurysm visualized.  Other findings: Small volume ascites.  IMPRESSION: 1. Gallbladder wall thickening with sludge and pericholecystic fluid is concerning for acute cholecystitis. Small amount ascites from liver dysfunction could contribute to the pericholecystic fluid. Consider nuclear medicine HIDA scan if clinical picture is equivocal. 2. Common bile duct is dilated to a greater degree than CT 1 day prior. This may relate to the pancreatitis or choledocholithiasis. 3. Mild prominence the pancreatic duct is similar to comparison CT. These results will be called to the ordering clinician or representative by the Radiologist Assistant, and communication documented in the PACS or zVision Dashboard.   Electronically Signed   By: Suzy Bouchard M.D.   On: 03/01/2015 12:36    Scheduled Meds: . antiseptic oral rinse  7 mL Mouth Rinse q12n4p  . chlorhexidine  15 mL Mouth Rinse BID  . cloNIDine  0.1 mg Oral BID  . docusate sodium  100 mg Oral BID  . folic acid  1 mg Oral Daily  . multivitamin with minerals  1 tablet Oral Daily  . piperacillin-tazobactam (ZOSYN)  IV  3.375 g Intravenous Q8H  . potassium chloride  40 mEq Oral Q6H  . sodium chloride  3 mL Intravenous Q12H  . thiamine  100 mg Oral Daily   Or  . thiamine  100 mg Intravenous Daily   Continuous Infusions: . sodium chloride 100 mL/hr at 03/02/15 1625  . pantoprozole (PROTONIX) infusion 8 mg/hr (03/02/15 1627)    Principal Problem:    Pancreatitis Active Problems:   Acute alcoholic hepatitis   Acute kidney injury ([place])   Alcohol abuse   Metabolic acidosis   Leukocytosis   Ketoacidosis   Lactic acidosis   Abnormal liver function tests   Shock liver   Melena   Protein-calorie malnutrition, severe ([place])    Time spent: 35 min    Kelvin Cellar  [facility] Pager [phone]. If 7PM-7AM, please contact night-coverage at [email or web address], password [facility] 03/03/2015, 8:09 AM  LOS: 3 days

## 2015-03-04 ENCOUNTER — Inpatient Hospital Stay (HOSPITAL_COMMUNITY): Payer: MEDICAID | Admitting: Registered Nurse

## 2015-03-04 ENCOUNTER — Encounter (HOSPITAL_COMMUNITY)
Admission: EM | Disposition: A | Discharge status: Other Acute Inpatient Hospital | Payer: Self-pay | Source: Home / Self Care | Attending: Internal Medicine

## 2015-03-04 ENCOUNTER — Inpatient Hospital Stay (HOSPITAL_COMMUNITY): Payer: Self-pay | Admitting: Registered Nurse

## 2015-03-04 ENCOUNTER — Encounter (HOSPITAL_COMMUNITY): Payer: Self-pay | Admitting: Registered Nurse

## 2015-03-04 DIAGNOSIS — K920 Hematemesis: Secondary | ICD-10-CM | POA: Insufficient documentation

## 2015-03-04 HISTORY — PX: ESOPHAGOGASTRODUODENOSCOPY (EGD) WITH PROPOFOL: SHX5813

## 2015-03-04 LAB — COMPREHENSIVE METABOLIC PANEL
ALBUMIN: 2.6 g/dL — AB (ref 3.5–5.0)
ALK PHOS: 111 U/L (ref 38–126)
ALT: 146 U/L — AB (ref 17–63)
ANION GAP: 7 (ref 5–15)
AST: 196 U/L — ABNORMAL HIGH (ref 15–41)
BILIRUBIN TOTAL: 0.8 mg/dL (ref 0.3–1.2)
BUN: 5 mg/dL — AB (ref 6–20)
CALCIUM: 8.8 mg/dL — AB (ref 8.9–10.3)
CO2: 23 mmol/L (ref 22–32)
CREATININE: 0.83 mg/dL (ref 0.61–1.24)
Chloride: 107 mmol/L (ref 101–111)
GFR calc Af Amer: >60 mL/min (ref >60)
GFR calc non Af Amer: >60 mL/min (ref >60)
GLUCOSE: 105 mg/dL — AB (ref 65–99)
Potassium: 4 mmol/L (ref 3.5–5.1)
SODIUM: 137 mmol/L (ref 135–145)
TOTAL PROTEIN: 5.4 g/dL — AB (ref 6.5–8.1)

## 2015-03-04 LAB — CBC
HCT: 26.5 % — ABNORMAL LOW (ref 39.0–52.0)
HEMOGLOBIN: 9.2 g/dL — AB (ref 13.0–17.0)
MCH: 33.2 pg (ref 26.0–34.0)
MCHC: 34.7 g/dL (ref 30.0–36.0)
MCV: 95.7 fL (ref 78.0–100.0)
PLATELETS: 79 10*3/uL — AB (ref 150–400)
RBC: 2.77 MIL/uL — ABNORMAL LOW (ref 4.22–5.81)
RDW: 14.8 % (ref 11.5–15.5)
WBC: 7.6 10*3/uL (ref 4.0–10.5)

## 2015-03-04 LAB — GLUCOSE, CAPILLARY: Glucose-Capillary: 102 mg/dL — ABNORMAL HIGH (ref 65–99)

## 2015-03-04 LAB — LIPASE, BLOOD: LIPASE: 291 U/L — AB (ref 22–51)

## 2015-03-04 SURGERY — ESOPHAGOGASTRODUODENOSCOPY (EGD) WITH PROPOFOL
Anesthesia: General

## 2015-03-04 MED ORDER — LACTATED RINGERS IV SOLN
INTRAVENOUS | Status: DC | PRN
  Administered 2015-03-04 (×2): via INTRAVENOUS

## 2015-03-04 MED ORDER — PROPOFOL 10 MG/ML IV BOLUS
INTRAVENOUS | Status: DC | PRN
  Administered 2015-03-04: 20 mg via INTRAVENOUS
  Administered 2015-03-04: 200 mg via INTRAVENOUS

## 2015-03-04 MED ORDER — LIDOCAINE HCL (CARDIAC) 20 MG/ML IV SOLN
INTRAVENOUS | Status: DC | PRN
  Administered 2015-03-04: 75 mg via INTRAVENOUS
  Administered 2015-03-04: 25 mg via INTRATRACHEAL

## 2015-03-04 MED ORDER — SUCCINYLCHOLINE CHLORIDE 20 MG/ML IJ SOLN
INTRAMUSCULAR | Status: DC | PRN
  Administered 2015-03-04: 100 mg via INTRAVENOUS

## 2015-03-04 MED ORDER — PROPOFOL 10 MG/ML IV BOLUS
INTRAVENOUS | Status: AC
  Filled 2015-03-04: qty 20

## 2015-03-04 MED ORDER — LIDOCAINE HCL (CARDIAC) 20 MG/ML IV SOLN
INTRAVENOUS | Status: AC
  Filled 2015-03-04: qty 5

## 2015-03-04 MED ORDER — FENTANYL CITRATE (PF) 100 MCG/2ML IJ SOLN
INTRAMUSCULAR | Status: DC | PRN
  Administered 2015-03-04 (×2): 50 ug via INTRAVENOUS

## 2015-03-04 MED ORDER — FENTANYL CITRATE (PF) 100 MCG/2ML IJ SOLN
INTRAMUSCULAR | Status: AC
  Filled 2015-03-04: qty 4

## 2015-03-04 SURGICAL SUPPLY — 14 items

## 2015-03-04 NOTE — Progress Notes (Signed)
      Gastroenterology Progress Note  Subjective:  Feels ok.  Asking again when he can eat.  No new complaints.  Objective:  Vital signs in last 24 hours: Temp:  [97.5 F (36.4 C)-98.2 F (36.8 C)] 98.1 F (36.7 C) (10/04 0612) Pulse Rate:  [56-67] 56 (10/04 0612) Resp:  [11-18] 18 (10/04 0612) BP: (117-145)/(70-83) 145/79 mmHg (10/04 0612) SpO2:  [98 %-100 %] 100 % (10/04 0612) Last BM Date: 03/02/15 General:  Alert, Well-developed, in NAD Heart:  Slightly bradycardic; no murmurs Pulm:  CTAB.  No W/R/R. Abdomen:  Soft, non-distended.  BS present.  Non-tender. Extremities:  Without edema. Neurologic:  Alert and oriented x 4; grossly normal neurologically.  Mild tremors, but no asterixis.   Intake/Output from previous day: 10/03 0701 - 10/04 0700 In: 3170 [P.O.:760; I.V.:2410] Out: 7793 [Urine:3630] Intake/Output this shift: Total I/O In: 0  Out: 400 [Urine:400]  Lab Results:  Recent Labs  03/02/15 1209 03/03/15 0358 03/04/15 0540  WBC 7.4 7.6 7.6  HGB 9.1* 9.1* 9.2*  HCT 25.9* 25.8* 26.5*  PLT 51* 51* 79*   BMET  Recent Labs  03/02/15 1209 03/03/15 0358 03/04/15 0540  NA 135 137 137  K 3.4* 3.0* 4.0  CL 106 106 107  CO2 25 25 23   GLUCOSE 113* 92 105*  BUN <5* <5* 5*  CREATININE 0.80 0.76 0.83  CALCIUM 7.6* 7.9* 8.8*   LFT  Recent Labs  03/04/15 0540  PROT 5.4*  ALBUMIN 2.6*  AST 196*  ALT 146*  ALKPHOS 111  BILITOT 0.8   PT/INR  Recent Labs  03/02/15 1209  LABPROT 13.8  INR 1.04   Hepatitis Panel  Recent Labs  03/01/15 0925  HEPBSAG Negative  HEPBIGM Negative   Assessment / Plan: *59 year old male with a personal history of renal cell carcinoma status post nephrectomy and a long-standing history of alcohol abuse/substance abuse.  Admitted with elevated LFTs along with pancreatitis likely due to his alcoholism.  Hepatitis/transaminitis may have an ischemic etiology/shock liver as patient had been abusing cocaine.   Hepatitis serologies neg--recommend triwnrix vaccine series (can get as out pt).  LFTs improving.    *Heme positive/dark stools--Patient reported hematemesis Friday.  ? Gastritis/ulcer/portal gastropathy/varices. *Acute drop in Hgb:  Likely combination of dilution and blood loss.  Hgb stable today.  Plan for EGD with MAC sedation later today.. *Alcohol abuse--CIWA protocol. Supportive care. *Thrombocytopenia:  Likely to to his liver disease secondary to EtOH. INR normal at this time. *Pancreatitis--likely secondary to EtOH. Continue analgesia and IV fluids.  Can likely resume full liquids post-EGD today. *AKI--likely due to dehydration. Creatinine improved with IV fluids.   LOS: 4 days   ZEHR, JESSICA D.  03/04/2015, 9:00 AM  Pager number [phone]    Attending physician's note   I have taken an interval history, reviewed the chart and examined the patient. I agree with the Advanced Practitioner's note, impression and recommendations.   Damaris Hippo MD [phone] Mon-Fri 8a-5p [phone] after 5p, weekends, holidays

## 2015-03-04 NOTE — Transfer of Care (Signed)
Immediate Anesthesia Transfer of Care Note  Patient: Jeremy [name]  Procedure(s) Performed: Procedure(s): ESOPHAGOGASTRODUODENOSCOPY (EGD) WITH PROPOFOL (N/A)  Patient Location: PACU  Anesthesia Type:General  Level of Consciousness: awake, alert , oriented and patient cooperative  Airway & Oxygen Therapy: Patient Spontanous Breathing and Patient connected to face mask oxygen  Post-op Assessment: Report given to RN, Post -op Vital signs reviewed and stable and Patient moving all extremities X 4  Post vital signs: stable  Last Vitals:  Filed Vitals:   03/04/15 1426  BP: 148/79  Pulse: 63  Temp: 36.7 C  Resp: 19    Complications: No apparent anesthesia complications

## 2015-03-04 NOTE — Op Note (Signed)
[facility] [place], [id number]   ENDOSCOPY PROCEDURE REPORT  PATIENT: [name], Jeremy  MR#: [id number] BIRTHDATE: Dec 10, 1955 , 59  yrs. old GENDER: male ENDOSCOPIST: Ladene Artist, MD, [facility] REFERRED BY:  [facility] PROCEDURE DATE:  03/04/2015 PROCEDURE:  EGD, diagnostic ASA CLASS:     Class III INDICATIONS:  melena and hematemesis. MEDICATIONS: Per Anesthesia TOPICAL ANESTHETIC: none DESCRIPTION OF PROCEDURE: After the risks benefits and alternatives of the procedure were thoroughly explained, informed consent was obtained.  The [place] [id number] endoscope was introduced through the mouth and advanced to the second portion of the duodenum , Without limitations.  The instrument was slowly withdrawn as the mucosa was fully examined.    DUODENUM: Two healing, non-bleeding, clean-based, linear and shallow ulcers, ranging between 3-5 mm in size, were found in the duodenal bulb.   The duodenal mucosa showed no abnormalities in the 2nd part of the duodenum. STOMACH: The mucosa and folds of the stomach appeared normal.  No gastric varices or changes of portal gastropathy noted. ESOPHAGUS: The mucosa of the esophagus appeared normal.  No esophageal varies noted. Retroflexed views revealed no abnormalities.   The scope was then withdrawn from the patient and the procedure completed.  COMPLICATIONS: There were no immediate complications.  ENDOSCOPIC IMPRESSION: 1.   Two non-bleeding ulcers in the duodenal bulb 2.   The EGD otherwise appeared normal  RECOMMENDATIONS: 1.  Continue PPI bid for 2 weeks then qam long term 2.  Avoid NSAIDS and etoh 3.  Check H pylori Ab and treat if positive  eSigned:  Ladene Artist, MD, [facility] 03/04/2015 2:27 PM

## 2015-03-04 NOTE — Anesthesia Preprocedure Evaluation (Signed)
Anesthesia Evaluation  Patient identified by MRN, date of birth, ID band Patient awake    Reviewed: Allergy & Precautions, NPO status , Patient's Chart, lab work & pertinent test results  Airway Mallampati: II  TM Distance: >3 FB Neck ROM: Full    Dental no notable dental hx. (+) Missing   Pulmonary neg pulmonary ROS, former smoker,    Pulmonary exam normal breath sounds clear to auscultation       Cardiovascular negative cardio ROS Normal cardiovascular exam Rhythm:Regular Rate:Normal     Neuro/Psych negative neurological ROS  negative psych ROS   GI/Hepatic Neg liver ROS,   Endo/Other  negative endocrine ROS  Renal/GU negative Renal ROS  negative genitourinary   Musculoskeletal negative musculoskeletal ROS (+)   Abdominal   Peds negative pediatric ROS (+)  Hematology negative hematology ROS (+)   Anesthesia Other Findings   Reproductive/Obstetrics negative OB ROS                             Anesthesia Physical Anesthesia Plan  ASA: II  Anesthesia Plan: General   Post-op Pain Management:    Induction: Intravenous, Rapid sequence and Cricoid pressure planned  Airway Management Planned: Oral ETT  Additional Equipment:   Intra-op Plan:   Post-operative Plan: Extubation in OR  Informed Consent: I have reviewed the patients History and Physical, chart, labs and discussed the procedure including the risks, benefits and alternatives for the proposed anesthesia with the patient or authorized representative who has indicated his/her understanding and acceptance.   Dental advisory given  Plan Discussed with: CRNA  Anesthesia Plan Comments:         Anesthesia Quick Evaluation

## 2015-03-04 NOTE — Progress Notes (Signed)
  Received psych consult, reviewed the information documented and made an attempt to complete today. Staff on the unit informed that he has been out of unit for a procedure. Will try to complete the psych evaluation tomorrow.    Lashondra Vaquerano,JANARDHAHA R. 03/04/2015 1:37 PM

## 2015-03-04 NOTE — Interval H&P Note (Signed)
History and Physical Interval Note:  03/04/2015 1:48 PM  Jeremy [name]  has presented today for surgery, with the diagnosis of Hematemesis; dark, heme positive stools  The various methods of treatment have been discussed with the patient and family. After consideration of risks, benefits and other options for treatment, the patient has consented to  Procedure(s): ESOPHAGOGASTRODUODENOSCOPY (EGD) WITH PROPOFOL (N/A) as a surgical intervention .  The patient's history has been reviewed, patient examined, no change in status, stable for surgery.  I have reviewed the patient's chart and labs.  Questions were answered to the patient's satisfaction.     Pricilla Riffle. Fuller Plan

## 2015-03-04 NOTE — H&P (View-Only) (Signed)
[facility] Progress Note  Subjective:  Feels ok.  Still has some abdominal pain.  Somewhat sleepy from ativan. Would like to try clear liquids since he "hasn't had anything in stomach for 5 or 6 days".  Objective:  Vital signs in last 24 hours: Temp:  [97.8 F (36.6 C)-98.7 F (37.1 C)] 98.4 F (36.9 C) (10/03 0730) Pulse Rate:  [64-98] 76 (10/03 0100) Resp:  [11-18] 12 (10/03 0800) BP: (130-167)/(70-128) 145/84 mmHg (10/03 0800) SpO2:  [54 %-100 %] 100 % (10/03 0400) Last BM Date: 03/02/15 General:  Alert, Well-developed, in NAD; resting comfortable Heart:  Regular rate and rhythm; no murmurs Pulm:  CTAB.  No W/R/R. Abdomen:  Soft, non-distended. BS present.  Diffuse TTP > in the epigastrium. Extremities:  Without edema. Neurologic:  Alert and oriented x 4;  grossly normal neurologically.  Intake/Output from previous day: 10/02 0701 - 10/03 0700 In: 4903.3 [P.O.:1820; I.V.:2933.3; IV Piggyback:150] Out: 5400 [Urine:5400] Intake/Output this shift: Total I/O In: 240 [P.O.:240] Out: 400 [Urine:400]  Lab Results:  Recent Labs  03/02/15 0351 03/02/15 1209 03/03/15 0358  WBC 7.8 7.4 7.6  HGB 9.2* 9.1* 9.1*  HCT 25.4* 25.9* 25.8*  PLT 60* 51* 51*   BMET  Recent Labs  03/02/15 0351 03/02/15 1209 03/03/15 0358  NA 134* 135 137  K 3.4* 3.4* 3.0*  CL 105 106 106  CO2 24 25 25   GLUCOSE 141* 113* 92  BUN 6 <5* <5*  CREATININE 0.82 0.80 0.76  CALCIUM 7.6* 7.6* 7.9*   LFT  Recent Labs  03/03/15 0358  PROT 5.4*  ALBUMIN 2.8*  AST 409*  ALT 202*  ALKPHOS 119  BILITOT 0.8   PT/INR  Recent Labs  03/01/15 0110 03/02/15 1209  LABPROT 16.7* 13.8  INR 1.34 1.04   Hepatitis Panel  Recent Labs  02/28/15 1955 03/01/15 0925  HEPBSAG Negative Negative  HCVAB <0.1  --   HEPAIGM Negative  --   HEPBIGM Negative Negative    US Abdomen Complete  03/01/2015   CLINICAL DATA:  Alcoholic liver failure. Additional history of passive  carcinoma. LEFT nephrectomy for renal cell carcinoma.  EXAM: ULTRASOUND ABDOMEN COMPLETE  COMPARISON:  CT 02/28/2015  FINDINGS: Gallbladder: Gallbladder wall thickening to 4 mm. There is small amount pericholecystic fluid. The gallbladder is mildly distended at 4.6 cm. There is sludge within lumen gallbladder. Sonographic Murphy sign cannot be assessed due to patient sedation.  Common bile duct: Diameter: Proximal common bile duct measures 4 mm. The more distal common bile duct measures 8 mm.  Liver: Heterogeneous liver echotexture. No focal lesion. No duct dilatation.  IVC: No abnormality visualized.  Pancreas: Mild dilatation of the pancreatic duct.  Spleen: Size and appearance within normal limits.  Right Kidney: Length: 11.6. Small amount of perinephric fluid. Echogenicity within normal limits. No mass or hydronephrosis visualized.  Left Kidney: Length: Surgically absent. Echogenicity within normal limits. No mass or hydronephrosis visualized.  Abdominal aorta: No aneurysm visualized.  Other findings: Small volume ascites.  IMPRESSION: 1. Gallbladder wall thickening with sludge and pericholecystic fluid is concerning for acute cholecystitis. Small amount ascites from liver dysfunction could contribute to the pericholecystic fluid. Consider nuclear medicine HIDA scan if clinical picture is equivocal. 2. Common bile duct is dilated to a greater degree than CT 1 day prior. This may relate to the pancreatitis or choledocholithiasis. 3. Mild prominence the pancreatic duct is similar to comparison CT. These results will be called to the ordering clinician or representative  by the Radiologist Assistant, and communication documented in the PACS or zVision Dashboard.   Electronically Signed   By: Suzy Bouchard M.D.   On: 03/01/2015 12:36   Assessment / Plan: *59 year old male with a personal history of renal cell carcinoma status post nephrectomy and a long-standing history of alcohol abuse/substance abuse.   Admitted with elevated LFTs along with pancreatitis likely due to his alcoholism.  Hepatitis/transaminitis may have an ischemic etiology/shock liver as patient had been abusing cocaine.  Hepatitis serologies neg--recommend triwnrix vaccine series (can get as out pt). LFTs improving.   *Heme positive/dark stools--Patient reported hematemesis Friday.  ? Gastritis/ulcer/portal gastropathy/varices. *Acute drop in Hgb:  Likely combination of dilution and blood loss.  Hgb stable today.  Plan for EGD with MAC sedation 10/4. *Alcohol abuse--CIWA protocol. Supportive care. *Thrombocytopenia:  Likely to to his liver disease secondary to EtOH. INR normal at this time. *Pancreatitis--likely secondary to EtOH. Continue analgesia and IV fluids, bowel rest.  Lipase improving. *AKI--likely due to dehydration. Creatinine improved with IV fluids.  -Ok for clear liquids today and NPO after midnight for EGD 10/4.    LOS: 3 days   ZEHR, JESSICA D.  03/03/2015, 9:06 AM  Pager number [phone]     Attending physician's note   I have taken an interval history, reviewed the chart and examined the patient. I agree with the Advanced Practitioner's note, impression and recommendations.   Lucio Edward, MD [facility] [phone] Mon-Fri 8a-5p [phone] after 5p, weekends, holidays

## 2015-03-04 NOTE — Progress Notes (Addendum)
[facility] PROGRESS NOTE  Jeremy [name] JSH:[id number] DOB: 04-02-1956 DOA: 02/28/2015 PCP: No primary care provider on file.  Jeremy [name] is a 59 year old male with a past medical history renal cell carcinoma status post left nephrectomy in 2009, chronic alcoholism, homelessness, admitted to the medicine service on 02/28/2015 when he presented to the emergency department with complaints of abdominal pain associate with nausea/vomiting and generalized weakness. He reports drinking 2/5 of liquor on a daily basis. On admission lab work revealed an AST of 2177 with ALT of 566, bicarbonate of 9 with creatinine of 1.7. He was admitted to the stepdown units and started on IV fluids. He has required Ativan for signs symptoms of alcohol withdrawal. Based on a.m. lab work he had a meld score of 16 with a hepatic  Assessment/Plan:  Liver failure -[name] presents with a history of daily heavy alcohol use, having severe abdominal pain associate with nausea and vomiting that would be consistent with alcoholic hepatitis.  -Labs indicate a MELD score of 16 with a Hepatic Discriminate Function of 23.8. I didn't think he would benefit from systemic steroids. -Although alcoholic hepatitis was on the differential, his liver function tests revealed an AST of 3630 with ALT of 572. I think the degree of AST/ALT elevation would seem unusual for an acute alcoholic hepatitis, and I'm concerned for other mechanisms of liver injury. Repeat labs on 03/02/2015 showing downward trend in his enzymes, AST coming down to 1085 and ALT at 288.  -He was further worked up for hepatitis A, hepatitis B, hepatitis C which came back negative -HIV was negative -Ischemia is a possibility having urine drug screen positive for cocaine -Abdominal ultrasound did reveal the presence of gallbladder wall thickening to 4 mm with presence of sludge within the gallbladder. There was also some common bile duct dilatation, measuring 8 mm in the  more distal portion.   - surgery who did not recommend cholecystectomy at this time given multiple acute issues. Furthermore it is possible that gallbladder changes may have resulted from hepatitis. They did not recommend continuing antibiotic therapy.  -labs showing downward trend in AST/ALT -EGD this AM  Alcohol abuse with alcohol withdrawals  -Patient reporting drinking 2/5ths of liquor on a daily basis --Will continue management with IV Ativan, thiamine and folate, IV fluids, supportive care.  Acute kidney injury. -This is likely due to profound dehydration as he presented with a creatinine of 1.7 with BUN of 23.  -improving  Anion gap metabolic acidosis -Initial labs revealed a bicarbonate of 9 with anion gap of 36 -This is likely related to alcoholic ketoacidosis characterized by starvation -labs showing closing of his gap   Probable alcoholic pancreatitis -trend   Hypokalemia. -trend  Suicidal ideations -no plan but feels hopeless and wants to end it all -sitter -psych consult   Code Status: Full code Family Communication: Family not present Disposition Plan: Plan to transfer to med/surg   Consultants:  GI  General surgery  HPI/Subjective: still feels hopeless Wants to eat   Objective: Filed Vitals:   03/04/15 0612  BP: 145/79  Pulse: 56  Temp: 98.1 F (36.7 C)  Resp: 18    Intake/Output Summary (Last 24 hours) at 03/04/15 0938 Last data filed at 03/04/15 0825  Gross per 24 hour  Intake   2930 ml  Output   3630 ml  Net   -700 ml   Filed Weights   03/01/15 2306  Weight: 66.9 kg (147 lb 7.8 oz)    Exam:  General:  Disheveled, chronically ill-appearing  Cardiovascular: Regular rate and rhythm normal S1-S2 no murmurs or gallops  Respiratory: Normal respiratory effort, lungs overall clear to auscultation bilaterally  Abdomen: +BS,  pain with palpation particularly over epigastric and right upper quadrant regions. Musculoskeletal: no  edema   Data Reviewed: Basic Metabolic Panel:  Recent Labs Lab 03/01/15 0110 03/02/15 0351 03/02/15 1209 03/03/15 0358 03/04/15 0540  NA 134* 134* 135 137 137  K 4.5 3.4* 3.4* 3.0* 4.0  CL 101 105 106 106 107  CO2 17* 24 25 25 23   GLUCOSE 106* 141* 113* 92 105*  BUN 17 6 <5* <5* 5*  CREATININE 1.38* 0.82 0.80 0.76 0.83  CALCIUM 7.0* 7.6* 7.6* 7.9* 8.8*   Liver Function Tests:  Recent Labs Lab 03/01/15 0110 03/02/15 0351 03/02/15 1209 03/03/15 0358 03/04/15 0540  AST 3630* 1085* 718* 409* 196*  ALT 572* 288* 250* 202* 146*  ALKPHOS 174* 130* 126 119 111  BILITOT 2.2* 1.0 0.9 0.8 0.8  PROT 5.5* 5.1* 5.0* 5.4* 5.4*  ALBUMIN 3.1* 2.7* 2.7* 2.8* 2.6*    Recent Labs Lab 02/28/15 1700 03/02/15 0351 03/03/15 0358 03/04/15 0540  LIPASE 151* 392* 197* 291*   No results for input(s): AMMONIA in the last 168 hours. CBC:  Recent Labs Lab 02/28/15 1700 03/01/15 0110 03/02/15 0351 03/02/15 1209 03/03/15 0358 03/04/15 0540  WBC 22.4* 10.8* 7.8 7.4 7.6 7.6  NEUTROABS 19.3*  --   --   --   --   --   HGB 13.0 11.2* 9.2* 9.1* 9.1* 9.2*  HCT 37.8* 31.6* 25.4* 25.9* 25.8* 26.5*  MCV 98.2 95.2 94.4 94.2 95.6 95.7  PLT 104* 87* 60* 51* 51* 79*   Cardiac Enzymes: No results for input(s): CKTOTAL, CKMB, CKMBINDEX, TROPONINI in the last 168 hours. BNP (last 3 results) No results for input(s): BNP in the last 8760 hours.  ProBNP (last 3 results) No results for input(s): PROBNP in the last 8760 hours.  CBG:  Recent Labs Lab 02/28/15 2142 03/01/15 0745 03/02/15 0746 03/03/15 0758 03/04/15 0821  GLUCAP 144* 128* 153* 80 102*    Recent Results (from the past 240 hour(s))  MRSA PCR Screening     Status: None   Collection Time: 02/28/15 11:31 PM  Result Value Ref Range Status   MRSA by PCR NEGATIVE NEGATIVE Final    Comment:        The GeneXpert MRSA Assay (FDA approved for NASAL specimens only), is one component of a comprehensive MRSA  colonization surveillance program. It is not intended to diagnose MRSA infection nor to guide or monitor treatment for MRSA infections.   C difficile quick scan w PCR reflex     Status: Abnormal   Collection Time: 03/01/15  3:30 PM  Result Value Ref Range Status   C Diff antigen POSITIVE (A) NEGATIVE Final   C Diff toxin NEGATIVE NEGATIVE Final   C Diff interpretation   Final    C. difficile present, but toxin not detected. This indicates colonization. In most cases, this does not require treatment. If patient has signs and symptoms consistent with colitis, consider treatment. Requires ENTERIC precautions.     Studies: No results found.  Scheduled Meds: . antiseptic oral rinse  7 mL Mouth Rinse q12n4p  . chlorhexidine  15 mL Mouth Rinse BID  . cloNIDine  0.1 mg Oral BID  . docusate sodium  100 mg Oral BID  . folic acid  1 mg Oral Daily  . multivitamin with  minerals  1 tablet Oral Daily  . sodium chloride  3 mL Intravenous Q12H  . thiamine  100 mg Oral Daily   Or  . thiamine  100 mg Intravenous Daily   Continuous Infusions: . sodium chloride 100 mL/hr (03/03/15 2233)  . sodium chloride 20 mL/hr (03/03/15 2232)  . pantoprozole (PROTONIX) infusion 8 mg/hr (03/02/15 1627)    Principal Problem:   Pancreatitis Active Problems:   Acute alcoholic hepatitis   Acute kidney injury ([place])   Alcohol abuse   Metabolic acidosis   Leukocytosis   Ketoacidosis   Lactic acidosis   Abnormal liver function tests   Shock liver   Melena   Protein-calorie malnutrition, severe ([place])    Time spent: 25 min    Alonnah Lampkins  [facility] Pager [phone]. If 7PM-7AM, please contact night-coverage at [email or web address], password [facility] 03/04/2015, 9:38 AM  LOS: 4 days

## 2015-03-04 NOTE — Anesthesia Procedure Notes (Signed)
Procedure Name: Intubation Date/Time: 03/04/2015 2:02 PM Performed by: Lissa Morales Pre-anesthesia Checklist: Patient identified, Emergency Drugs available, Suction available and Patient being monitored Patient Re-evaluated:Patient Re-evaluated prior to inductionOxygen Delivery Method: Circle System Utilized Preoxygenation: Pre-oxygenation with 100% oxygen Intubation Type: IV induction Ventilation: Mask ventilation without difficulty and Oral airway inserted - appropriate to patient size Laryngoscope Size: Mac and 4 Grade View: Grade II Tube type: Oral Tube size: 7.5 mm Number of attempts: 1 Airway Equipment and Method: Stylet and Oral airway Placement Confirmation: ETT inserted through vocal cords under direct vision,  positive ETCO2 and breath sounds checked- equal and bilateral Secured at: 21 (small mouth) cm Tube secured with: Tape Dental Injury: Teeth and Oropharynx as per pre-operative assessment

## 2015-03-05 ENCOUNTER — Encounter (HOSPITAL_COMMUNITY): Payer: Self-pay | Admitting: Gastroenterology

## 2015-03-05 ENCOUNTER — Inpatient Hospital Stay (HOSPITAL_COMMUNITY): Payer: Self-pay

## 2015-03-05 DIAGNOSIS — E43 Unspecified severe protein-calorie malnutrition: Secondary | ICD-10-CM

## 2015-03-05 DIAGNOSIS — F332 Major depressive disorder, recurrent severe without psychotic features: Secondary | ICD-10-CM

## 2015-03-05 DIAGNOSIS — K7010 Alcoholic hepatitis without ascites: Secondary | ICD-10-CM

## 2015-03-05 DIAGNOSIS — R45851 Suicidal ideations: Secondary | ICD-10-CM

## 2015-03-05 DIAGNOSIS — G4700 Insomnia, unspecified: Secondary | ICD-10-CM

## 2015-03-05 DIAGNOSIS — F1010 Alcohol abuse, uncomplicated: Secondary | ICD-10-CM

## 2015-03-05 DIAGNOSIS — N179 Acute kidney failure, unspecified: Secondary | ICD-10-CM

## 2015-03-05 DIAGNOSIS — F1994 Other psychoactive substance use, unspecified with psychoactive substance-induced mood disorder: Secondary | ICD-10-CM

## 2015-03-05 LAB — COMPREHENSIVE METABOLIC PANEL
ALK PHOS: 105 U/L (ref 38–126)
ALT: 118 U/L — AB (ref 17–63)
AST: 107 U/L — ABNORMAL HIGH (ref 15–41)
Albumin: 2.8 g/dL — ABNORMAL LOW (ref 3.5–5.0)
Anion gap: 8 (ref 5–15)
BILIRUBIN TOTAL: 0.4 mg/dL (ref 0.3–1.2)
BUN: <5 mg/dL — ABNORMAL LOW (ref 6–20)
CALCIUM: 9 mg/dL (ref 8.9–10.3)
CO2: 27 mmol/L (ref 22–32)
CREATININE: 0.84 mg/dL (ref 0.61–1.24)
Chloride: 101 mmol/L (ref 101–111)
Glucose, Bld: 102 mg/dL — ABNORMAL HIGH (ref 65–99)
Potassium: 3.7 mmol/L (ref 3.5–5.1)
Sodium: 136 mmol/L (ref 135–145)
Total Protein: 5.7 g/dL — ABNORMAL LOW (ref 6.5–8.1)

## 2015-03-05 LAB — H PYLORI, IGM, IGG, IGA AB
H PYLORI IGG: 5.8 U/mL — AB (ref 0.0–0.8)
H. Pylogi, Iga Abs: >35 units — ABNORMAL HIGH (ref 0.0–8.9)
H. Pylogi, Igm Abs: <9 units (ref 0.0–8.9)

## 2015-03-05 LAB — CBC
HCT: 27.7 % — ABNORMAL LOW (ref 39.0–52.0)
Hemoglobin: 9.8 g/dL — ABNORMAL LOW (ref 13.0–17.0)
MCH: 33.8 pg (ref 26.0–34.0)
MCHC: 35.4 g/dL (ref 30.0–36.0)
MCV: 95.5 fL (ref 78.0–100.0)
PLATELETS: 124 10*3/uL — AB (ref 150–400)
RBC: 2.9 MIL/uL — AB (ref 4.22–5.81)
RDW: 14.5 % (ref 11.5–15.5)
WBC: 6.9 10*3/uL (ref 4.0–10.5)

## 2015-03-05 LAB — GLUCOSE, CAPILLARY: Glucose-Capillary: 98 mg/dL (ref 65–99)

## 2015-03-05 LAB — HEPATITIS B E ANTIGEN: HEP B E AG: NEGATIVE

## 2015-03-05 MED ORDER — ENSURE ENLIVE PO LIQD
237.0000 mL | Freq: Two times a day (BID) | ORAL | Status: DC
  Administered 2015-03-06 – 2015-03-10 (×7): 237 mL via ORAL

## 2015-03-05 MED ORDER — PANTOPRAZOLE SODIUM 40 MG IV SOLR
40.0000 mg | Freq: Two times a day (BID) | INTRAVENOUS | Status: DC
  Administered 2015-03-05 (×2): 40 mg via INTRAVENOUS
  Filled 2015-03-05 (×4): qty 40

## 2015-03-05 MED ORDER — GADOBENATE DIMEGLUMINE 529 MG/ML IV SOLN
15.0000 mL | Freq: Once | INTRAVENOUS | Status: AC | PRN
  Administered 2015-03-05: 13 mL via INTRAVENOUS
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
## 2015-03-05 NOTE — Progress Notes (Signed)
Nutrition Follow-up  DOCUMENTATION CODES:   Severe malnutrition in context of chronic illness  INTERVENTION:  - Continue Soft diet - Will order Ensure Enlive BID, each supplement provides 350 kcal and 20 grams of protein - RD will continue to monitor for needs  NUTRITION DIAGNOSIS:   Inadequate oral intake related to inability to eat as evidenced by NPO status. -improving with diet advancement  GOAL:   Patient will meet greater than or equal to 90% of their needs, Weight gain -unmet at this time  MONITOR:   PO intake, Weight trends, Labs, I & O's  ASSESSMENT:   59 year old male with a past medical history renal cell carcinoma status post left nephrectomy in 2009, chronic alcoholism, homelessness, admitted to the medicine service on 02/28/2015 when he presented to the emergency department with complaints of abdominal pain associate with nausea/vomiting and generalized weakness. He reports drinking 2/5 of liquor on a daily basis. On admission lab work revealed an AST of 2177 with ALT of 566, bicarbonate of 9 with creatinine of 1.7. He was admitted to the stepdown units and started on IV fluids. He has required Ativan for signs symptoms of alcohol withdrawal  10/4 Diet advanced from FLD to Soft after breakfast this AM and pt states he has not yet ordered lunch but plans to do so soon. He states that he has been unable to have breakfast since admission because all testing has taken place in the AM. He states he is still having intermittent abdominal pain but is not experiencing any nausea at this time.   Will order Ensure Enlive BID to supplement given severe malnutrition and desired weight gain. Not meeting needs. Medications reviewed. Labs reviewed; BUN <5.   10/2 - Patient admitted with weakness found to have pancreatitis and elevated LFTs.  - Patient with long-standing history of alcohol abuse, homelessness. Followed by GI.  - RD attempted assessment, pt drowsy, unable to arouse  for full conversation.  - Per RN, has had intermittent confusion.   Diet Order:  DIET SOFT Room service appropriate?: Yes; Fluid consistency:: Thin  Skin:   WDL  Last BM:  10/3  Height:   Ht Readings from Last 1 Encounters:  03/01/15 5\' 9"  (1.753 m)    Weight:   Wt Readings from Last 1 Encounters:  03/01/15 147 lb 7.8 oz (66.9 kg)    Ideal Body Weight:  72.7 kg  BMI:  Body mass index is 21.77 kg/(m^2).  Estimated Nutritional Needs:   Kcal:  1800-2020  Protein:  85g  Fluid:  2 L/d  EDUCATION NEEDS:   Education needs no appropriate at this time     Jarome Matin, RD, LDN Inpatient Clinical Dietitian Pager # [phone] After hours/weekend pager # [phone]

## 2015-03-05 NOTE — Anesthesia Postprocedure Evaluation (Signed)
  Anesthesia Post-op Note  Patient: Jeremy [name]  Procedure(s) Performed: Procedure(s) (LRB): ESOPHAGOGASTRODUODENOSCOPY (EGD) WITH PROPOFOL (N/A)  Patient Location: PACU  Anesthesia Type: general   Level of Consciousness: awake and alert   Airway and Oxygen Therapy: Patient Spontanous Breathing  Post-op Pain: mild  Post-op Assessment: Post-op Vital signs reviewed, Patient's Cardiovascular Status Stable, Respiratory Function Stable, Patent Airway and No signs of Nausea or vomiting  Last Vitals:  Filed Vitals:   03/04/15 2232  BP: 136/85  Pulse: 68  Temp: 36.4 C  Resp: 18    Post-op Vital Signs: stable   Complications: No apparent anesthesia complications

## 2015-03-05 NOTE — Progress Notes (Signed)
[facility] PROGRESS NOTE  Jeremy [name] NFA:[id number] DOB: [birth year] DOA: 02/28/2015 PCP: No primary care provider on file.  Jeremy [name] is a 59 year old male with a past medical history renal cell carcinoma status post left nephrectomy in 2009, chronic alcoholism, homelessness, admitted to the medicine service on 02/28/2015 when he presented to the emergency department with complaints of abdominal pain associate with nausea/vomiting and generalized weakness. He reports drinking 2/5 of liquor on a daily basis. On admission lab work revealed an AST of 2177 with ALT of 566, bicarbonate of 9 with creatinine of 1.7. He was admitted to the stepdown units and started on IV fluids. He has required Ativan for signs symptoms of alcohol withdrawal. Based on a.m. lab work he had a meld score of 16 with a hepatic  Assessment/Plan:  Alcoholic pancreatitis -improving slowly, still very tender epigastrium -continue IVF, Full liquids, PPI -supportive care -ETOH cessation  Duodenal ulcers -start IV PPI BID -ETOh cessation, Fu Hpylori  Alcoholic hepatitis -admits to heavy daily ETOH abuse -initial labs with MELD of 16 and DF of 23, hence steroids not indicated -also component of ischemic injury suspected based on cocaine use and severity of LFT derrangement -hepatitis serologies negative , will need vaccinations as outpatient-educated about this -LFTs improving -MRCP without CBD stones  Gall bladder sludge Abdominal ultrasound did reveal the presence of gallbladder wall thickening to 4 mm with presence of sludge within the gallbladder. There was also some common bile duct dilatation, measuring 8 mm in the more distal portion.   - surgery was consulted, didn't think picture consistent with cholecystitis and more so with pancreatitis and hepatitis -recommended outpatient FU  Alcohol abuse with alcohol withdrawals  -Patient reporting drinking 2/5ths of liquor on a daily basis -treated with Ativan  per CIWA protocol  Acute kidney injury. -This is likely due to profound dehydration as he presented with a creatinine of 1.7 with BUN of 23.  -improving  Anion gap metabolic acidosis -Initial labs revealed a bicarbonate of 9 with anion gap of 36 -This is likely related to alcoholic ketoacidosis, AKI and starvation -labs showing closing of his gap   Hypokalemia. -trend  HTN -on clonidine  Suicidal ideations -no plan but feels hopeless and wants to end it all -sitter -psych consult  Thrombocytopenia -due to liver dz  DVT proph: SCDs  Code Status: Full code Family Communication: Family not present Disposition Plan: Homeless   Consultants:  GI  General surgery  HPI/Subjective: SI comes and goes, still with abd pain   Objective: Filed Vitals:   03/04/15 2232  BP: 136/85  Pulse: 68  Temp: 97.6 F (36.4 C)  Resp: 18    Intake/Output Summary (Last 24 hours) at 03/05/15 0805 Last data filed at 03/05/15 4696  Gross per 24 hour  Intake   1811 ml  Output   3650 ml  Net  -1839 ml   Filed Weights   03/01/15 2306  Weight: 66.9 kg (147 lb 7.8 oz)    Exam:   General:  Disheveled, chronically ill-appearing  Cardiovascular: Regular rate and rhythm normal S1-S2 no murmurs or gallops  Respiratory: Normal respiratory effort, lungs overall clear to auscultation bilaterally  Abdomen: +BS,  tender with palpation particularly over epigastric and right upper quadrant regions. Musculoskeletal: no edema   Data Reviewed: Basic Metabolic Panel:  Recent Labs Lab 03/02/15 0351 03/02/15 1209 03/03/15 0358 03/04/15 0540 03/05/15 0610  NA 134* 135 137 137 136  K 3.4* 3.4* 3.0* 4.0 3.7  CL 105  106 106 107 101  CO2 24 25 25 23 27   GLUCOSE 141* 113* 92 105* 102*  BUN 6 <5* <5* 5* <5*  CREATININE 0.82 0.80 0.76 0.83 0.84  CALCIUM 7.6* 7.6* 7.9* 8.8* 9.0   Liver Function Tests:  Recent Labs Lab 03/02/15 0351 03/02/15 1209 03/03/15 0358 03/04/15 0540  03/05/15 0610  AST 1085* 718* 409* 196* 107*  ALT 288* 250* 202* 146* 118*  ALKPHOS 130* 126 119 111 105  BILITOT 1.0 0.9 0.8 0.8 0.4  PROT 5.1* 5.0* 5.4* 5.4* 5.7*  ALBUMIN 2.7* 2.7* 2.8* 2.6* 2.8*    Recent Labs Lab 02/28/15 1700 03/02/15 0351 03/03/15 0358 03/04/15 0540  LIPASE 151* 392* 197* 291*   No results for input(s): AMMONIA in the last 168 hours. CBC:  Recent Labs Lab 02/28/15 1700  03/02/15 0351 03/02/15 1209 03/03/15 0358 03/04/15 0540 03/05/15 0610  WBC 22.4*  < > 7.8 7.4 7.6 7.6 6.9  NEUTROABS 19.3*  --   --   --   --   --   --   HGB 13.0  < > 9.2* 9.1* 9.1* 9.2* 9.8*  HCT 37.8*  < > 25.4* 25.9* 25.8* 26.5* 27.7*  MCV 98.2  < > 94.4 94.2 95.6 95.7 95.5  PLT 104*  < > 60* 51* 51* 79* 124*  < > = values in this interval not displayed. Cardiac Enzymes: No results for input(s): CKTOTAL, CKMB, CKMBINDEX, TROPONINI in the last 168 hours. BNP (last 3 results) No results for input(s): BNP in the last 8760 hours.  ProBNP (last 3 results) No results for input(s): PROBNP in the last 8760 hours.  CBG:  Recent Labs Lab 03/01/15 0745 03/02/15 0746 03/03/15 0758 03/04/15 0821 03/05/15 0742  GLUCAP 128* 153* 80 102* 98    Recent Results (from the past 240 hour(s))  MRSA PCR Screening     Status: None   Collection Time: 02/28/15 11:31 PM  Result Value Ref Range Status   MRSA by PCR NEGATIVE NEGATIVE Final    Comment:        The GeneXpert MRSA Assay (FDA approved for NASAL specimens only), is one component of a comprehensive MRSA colonization surveillance program. It is not intended to diagnose MRSA infection nor to guide or monitor treatment for MRSA infections.   C difficile quick scan w PCR reflex     Status: Abnormal   Collection Time: 03/01/15  3:30 PM  Result Value Ref Range Status   C Diff antigen POSITIVE (A) NEGATIVE Final   C Diff toxin NEGATIVE NEGATIVE Final   C Diff interpretation   Final    C. difficile present, but toxin not  detected. This indicates colonization. In most cases, this does not require treatment. If patient has signs and symptoms consistent with colitis, consider treatment. Requires ENTERIC precautions.     Studies: No results found.  Scheduled Meds: . antiseptic oral rinse  7 mL Mouth Rinse q12n4p  . chlorhexidine  15 mL Mouth Rinse BID  . cloNIDine  0.1 mg Oral BID  . docusate sodium  100 mg Oral BID  . folic acid  1 mg Oral Daily  . multivitamin with minerals  1 tablet Oral Daily  . sodium chloride  3 mL Intravenous Q12H  . thiamine  100 mg Oral Daily   Or  . thiamine  100 mg Intravenous Daily   Continuous Infusions: . sodium chloride 100 mL/hr at 03/05/15 0110  . sodium chloride 20 mL/hr (03/03/15 2232)  Principal Problem:   Pancreatitis Active Problems:   Acute alcoholic hepatitis   Acute kidney injury ([place])   Alcohol abuse   Metabolic acidosis   Leukocytosis   Ketoacidosis   Lactic acidosis   Abnormal liver function tests   Shock liver   Melena   Protein-calorie malnutrition, severe ([place])   Hematemesis    Time spent: 25 min    Savi Lastinger  [facility] Pager [phone] If 7PM-7AM, please contact night-coverage at [email or web address], password [email or web address] 03/05/2015, 8:05 AM  LOS: 5 days

## 2015-03-05 NOTE — Progress Notes (Addendum)
[facility] Progress Note  Subjective:  Feeling better.  Still with some abdominal soreness.  Wants to eat.    Objective:  Vital signs in last 24 hours: Temp:  [97.6 F (36.4 C)-98.1 F (36.7 C)] 97.6 F (36.4 C) (10/04 2232) Pulse Rate:  [58-84] 68 (10/04 2232) Resp:  [8-19] 18 (10/04 2232) BP: (128-174)/(71-93) 136/85 mmHg (10/04 2232) SpO2:  [100 %] 100 % (10/04 2232) Last BM Date: 03/02/15 General:  Alert, Well-developed, in NAD Heart:  Regular rate and rhythm; no murmurs Pulm:  CTAB.  No W/R/R. Abdomen:  Soft, non-distended.  Normal bowel sounds.  Old scar from nephrectomy noted.  Mild epigastric TTP.   Extremities:  Without edema. Neurologic:  Alert and oriented x 4;  grossly normal neurologically. Psych:  Alert and cooperative. Normal mood and affect.  Intake/Output from previous day: 10/04 0701 - 10/05 0700 In: 1811 [I.V.:1811] Out: 3650 [Urine:3650]  Lab Results:  Recent Labs  03/03/15 0358 03/04/15 0540 03/05/15 0610  WBC 7.6 7.6 6.9  HGB 9.1* 9.2* 9.8*  HCT 25.8* 26.5* 27.7*  PLT 51* 79* 124*   BMET  Recent Labs  03/03/15 0358 03/04/15 0540 03/05/15 0610  NA 137 137 136  K 3.0* 4.0 3.7  CL 106 107 101  CO2 25 23 27   GLUCOSE 92 105* 102*  BUN <5* 5* <5*  CREATININE 0.76 0.83 0.84  CALCIUM 7.9* 8.8* 9.0   LFT  Recent Labs  03/05/15 0610  PROT 5.7*  ALBUMIN 2.8*  AST 107*  ALT 118*  ALKPHOS 105  BILITOT 0.4   PT/INR  Recent Labs  03/02/15 1209  LABPROT 13.8  INR 1.04   Mr 3d Recon At Scanner  03/05/2015   CLINICAL DATA:  Subsequent encounter for abdominal pain and swelling with elevated LFTs. Patient with history of pancreatitis  EXAM: MRI ABDOMEN WITHOUT AND WITH CONTRAST (INCLUDING MRCP)  TECHNIQUE: Multiplanar multisequence MR imaging of the abdomen was performed both before and after the administration of intravenous contrast. Heavily T2-weighted images of the biliary and pancreatic ducts were obtained, and  three-dimensional MRCP images were rendered by post processing.  CONTRAST:  74mL MULTIHANCE GADOBENATE DIMEGLUMINE 529 MG/ML IV SOLN  COMPARISON:  CT scan from 02/28/2015  FINDINGS: Lower chest:  Unremarkable.  Hepatobiliary: No focal abnormality is seen in the liver parenchyma. No focal enhancing lesion. Gallbladder is distended with layering sludge and at least 1 at gallstone measuring in the 5 mm size range. There appears to be a trace amount of pericholecystic fluid. No substantial gallbladder wall thickening. No intrahepatic biliary duct dilatation. No dilatation of the extrahepatic bile ducts. No evidence for choledocholithiasis.  Pancreas: Diffuse fullness of pancreatic tissue again noted, similar to recent CT scan. There is enhancement of the parenchyma throughout with no features to suggest pancreatic necrosis. No dilatation of the main pancreatic duct. The 5 mm cystic focus is identified in the pancreas near the junction of the body and tail (see image 27 series 3 and image 50 series 1102). A small amount of peripancreatic edema persists. No evidence for pseudocyst. No evidence for retroperitoneal abscess.  Spleen: No splenomegaly. No focal mass lesion.  Adrenals/Urinary Tract: The adrenal glands are normal bilaterally. Left kidney is surgically absent. Right kidney has normal MR imaging features.  Stomach/Bowel: Stomach is nondistended. No gastric wall thickening. No evidence of outlet obstruction. Duodenum is normally positioned as is the ligament of Treitz.  Vascular/Lymphatic: No abdominal aortic aneurysm. The portal vein, superior mesenteric vein,  and splenic vein are patent. There is no gastrohepatic or hepatoduodenal ligament lymphadenopathy. No intraperitoneal or retroperitoneal lymphadenopy.  Other: Trace edema is seen in the retroperitoneal tissues around the pancreas. No substantial intraperitoneal free fluid.  Musculoskeletal: No abnormal marrow signal within the visualized bony anatomy.   IMPRESSION: 1. Cholelithiasis without evidence for choledocholithiasis. There is a trace amount of fluid adjacent to the gallbladder, but no overt gallbladder wall thickening to suggest acute cholecystitis. 2. Mild peripancreatic edema without pancreatic necrosis, pseudocyst, or retroperitoneal abscess. A 5 mm cystic focus towards the tail the pancreas may be related to the inflammatory change. No dilatation of the main pancreatic duct. 3. Status post left nephrectomy.   Electronically Signed   By: Misty Stanley M.D.   On: 03/05/2015 08:29   Mr Jeananne Rama W/wo Cm/mrcp  03/05/2015   CLINICAL DATA:  Subsequent encounter for abdominal pain and swelling with elevated LFTs. Patient with history of pancreatitis  EXAM: MRI ABDOMEN WITHOUT AND WITH CONTRAST (INCLUDING MRCP)  TECHNIQUE: Multiplanar multisequence MR imaging of the abdomen was performed both before and after the administration of intravenous contrast. Heavily T2-weighted images of the biliary and pancreatic ducts were obtained, and three-dimensional MRCP images were rendered by post processing.  CONTRAST:  70mL MULTIHANCE GADOBENATE DIMEGLUMINE 529 MG/ML IV SOLN  COMPARISON:  CT scan from 02/28/2015  FINDINGS: Lower chest:  Unremarkable.  Hepatobiliary: No focal abnormality is seen in the liver parenchyma. No focal enhancing lesion. Gallbladder is distended with layering sludge and at least 1 at gallstone measuring in the 5 mm size range. There appears to be a trace amount of pericholecystic fluid. No substantial gallbladder wall thickening. No intrahepatic biliary duct dilatation. No dilatation of the extrahepatic bile ducts. No evidence for choledocholithiasis.  Pancreas: Diffuse fullness of pancreatic tissue again noted, similar to recent CT scan. There is enhancement of the parenchyma throughout with no features to suggest pancreatic necrosis. No dilatation of the main pancreatic duct. The 5 mm cystic focus is identified in the pancreas near the junction of  the body and tail (see image 27 series 3 and image 50 series 1102). A small amount of peripancreatic edema persists. No evidence for pseudocyst. No evidence for retroperitoneal abscess.  Spleen: No splenomegaly. No focal mass lesion.  Adrenals/Urinary Tract: The adrenal glands are normal bilaterally. Left kidney is surgically absent. Right kidney has normal MR imaging features.  Stomach/Bowel: Stomach is nondistended. No gastric wall thickening. No evidence of outlet obstruction. Duodenum is normally positioned as is the ligament of Treitz.  Vascular/Lymphatic: No abdominal aortic aneurysm. The portal vein, superior mesenteric vein, and splenic vein are patent. There is no gastrohepatic or hepatoduodenal ligament lymphadenopathy. No intraperitoneal or retroperitoneal lymphadenopy.  Other: Trace edema is seen in the retroperitoneal tissues around the pancreas. No substantial intraperitoneal free fluid.  Musculoskeletal: No abnormal marrow signal within the visualized bony anatomy.  IMPRESSION: 1. Cholelithiasis without evidence for choledocholithiasis. There is a trace amount of fluid adjacent to the gallbladder, but no overt gallbladder wall thickening to suggest acute cholecystitis. 2. Mild peripancreatic edema without pancreatic necrosis, pseudocyst, or retroperitoneal abscess. A 5 mm cystic focus towards the tail the pancreas may be related to the inflammatory change. No dilatation of the main pancreatic duct. 3. Status post left nephrectomy.   Electronically Signed   By: Misty Stanley M.D.   On: 03/05/2015 08:29   Assessment / Plan: *59 year old male with a personal history of renal cell carcinoma status post nephrectomy and a long-standing history of  alcohol abuse/substance abuse. Admitted with elevated LFTs along with pancreatitis likely due to his alcoholism. Hepatitis/transaminitis may have an ischemic etiology/shock liver as patient had been abusing cocaine. Hepatitis serologies neg--recommend  triwnrix vaccine series (can get as out pt). LFTs improving.  No CBD stones on MRCP. *Duodenal ulcers:  Two healing/non-bleeding ulcers seen on EGD 10/4.  Continue BID PPI x 2 weeks then daily long-term.  Hpylori Ab pending; if positive then will need treatment.  Avoid ETOH and NSAID's.  *Heme positive/dark stools:  Secondary to above. *Acute drop in Hgb: Hgb stable/improving. *Alcohol abuse--CIWA protocol. Supportive care. *Thrombocytopenia: Likely to to his liver disease secondary to EtOH. INR normal at this time.  Platelet count improving. *Pancreatitis--likely secondary to EtOH. Continue analgesia and IV fluids.  Will advance to soft diet today; advised nothing fried/greasy. *AKI--likely due to dehydration. Creatinine improved with IV fluids.  **Will sign off from GI standpoint.  Call with questions.   LOS: 5 days   ZEHR, JESSICA D.  03/05/2015, 8:53 AM  Pager number [phone]     Attending physician's note   I have taken an interval history, reviewed the chart and examined the patient. I agree with the Advanced Practitioner's note, impression and recommendations. MRCP shows cholelithiasis, mild pancreatitis and no CBD stones. Pancreatitis likely from etoh. Advance diet gradually as tolerated. Avoid etoh and NSAIDs. DUs with bleed-resolved. Continue PPI and treat for H pylori if Ab is positive. GI signing off. Follow up with PCP.   Lucio Edward, MD [facility] [phone] Mon-Fri 8a-5p [phone] after 5p, weekends, holidays

## 2015-03-05 NOTE — Clinical Social Work Psych Assess (Signed)
Clinical Social Work Nature conservation officer  Clinical Social Worker:  Raymondo Band, [place] Date/Time:  03/05/2015, 12:27 PM Referred By:  Physician Date Referred:  03/04/15 Reason for Referral:  Behavioral Health Issues, Homelessness, Substance Abuse   Presenting Symptoms/Problems  Presenting Symptoms/Problems(in person's/family's own words):  Per patient, "I was drinking a lot of alcohol and ended up falling out on Friday night". "I have been on a binge for about 40 days now". "I will drink anything".    Abuse/Neglect/Trauma History  Abuse/Neglect/Trauma History:  Denies History Abuse/Neglect/Trauma History Comments (indicate dates):  N/A   Psychiatric History  Psychiatric History:  Outpatient Treatment Psychiatric Medication:  N/A    Current Mental Health Hospitalizations/Previous Mental Health History:  Per patient, he states that he was diagnosed in the past with BiPolar Disorder. Patient unsure of MD who diagnosed.    Current Provider:  N/A Place and Date:  N/A  Current Medications:   Scheduled Meds: . antiseptic oral rinse  7 mL Mouth Rinse q12n4p  . chlorhexidine  15 mL Mouth Rinse BID  . cloNIDine  0.1 mg Oral BID  . docusate sodium  100 mg Oral BID  . feeding supplement (ENSURE ENLIVE)  237 mL Oral BID BM  . folic acid  1 mg Oral Daily  . multivitamin with minerals  1 tablet Oral Daily  . pantoprazole (PROTONIX) IV  40 mg Intravenous Q12H  . sodium chloride  3 mL Intravenous Q12H  . thiamine  100 mg Oral Daily   Or  . thiamine  100 mg Intravenous Daily   Continuous Infusions: . sodium chloride 50 mL/hr at 03/05/15 0808   PRN Meds:.diphenhydrAMINE, hydrALAZINE, HYDROmorphone (DILAUDID) injection, ondansetron **OR** ondansetron (ZOFRAN) IV'    Previous Inpatient Admission/Date/Reason:  Patient denies any prior inpatient admission.    Emotional Health/Current Symptoms  Suicide/Self Harm: Has a Plan for Suicide, Suicide Attempt in the Past  (date/description), Suicidal Ideation (ex. "I can't take anymore, I wish I could disappear") Suicide Attempt in Past (date/description):  Patient reports that he has attempted to commit suicide three times in the past. Last suicide attempt was about three weeks ago, and patient reports attempting to "poison himself with cocaine". Prior to that attempt, patient attempted to overdose twice (2years ago).   Other Harmful Behavior (ex. homicidal ideation) (describe):  N/A   Psychotic/Dissociative Symptoms  Psychotic/Dissociative Symptoms: None Reported Other Psychotic/Dissociative Symptoms:  N/A   Attention/Behavioral Symptoms  Attention/Behavioral Symptoms: Within Normal Limits Other Attention/Behavioral Symptoms:  Patient alert and oriented. Very cooperative with assessment.    Cognitive Impairment  Cognitive Impairment:  Within Normal Limits, Orientation - Place, Orientation - Self, Orientation - Situation Other Cognitive Impairment: N/A   Mood and Adjustment  Mood and Adjustment:  Depression, Flat, Mood Congruent   Stress, Anxiety, Trauma, Any Recent Loss/Stressor  Stress, Anxiety, Trauma, Any Recent Loss/Stressor: Grief/Loss (recent or history) Anxiety (frequency):  Patient reports feeling "hopeless and having no reason to live".   Phobia (specify):  N/A  Compulsive Behavior (specify):  N/A  Obsessive Behavior (specify):  N/A  Other Stress, Anxiety, Trauma, Any Recent Loss/Stressor:  N/A   Substance Abuse/Use  Substance Abuse/Use: Current Substance Use, History of Substance Use SBIRT Completed (please refer for detailed history): Yes Self-reported Substance Use (last use and frequency):  Patient reports drinking more than a case of beer daily. Patient reports that "he drinks as much as he can get a hold of". Patient also reports smoking crack twice every two weeks.   Urinary  Drug Screen Completed: N/A Alcohol Level:  283   Environment/Housing/Living  Arrangement  Environmental/Housing/Living Arrangement: Homeless Who is in the Home:  Patient reports he has been living on the streets for the past two months. Patient reports family is in [place], [place] and is unable to reside with them.   Emergency Contact:   Brooks,Apraia Daughter [phone]          Financial  Financial:  (uninsured )   Patient's Strengths and Goals  Patient's Strengths and Goals (patient's own words):  Patient was unable to identify any strengths, stating "I lost my job six months ago". "I don't have a place to stay". "I really have nothing to live for".    Clinical Social Worker's Interpretive Summary  Clinical Social Workers Interpretive Summary:  CSW received consult to speak with patient regarding substance abuse and mental health concerns. CSW introduced self and acknowledged the patient. Patient currently has a sitter present. Patient was alert and oriented, however, appeared to be somewhat agitated. Patient was agreeable to speak with CSW. Patient reports that he has been living on the streets for the past 2 months. Patient reports that he passed out on Friday night after drinking a lot of alcohol. Patient unable to provide amount stating, "I drank as much as I could". Patient reports that he has been on a binge for alcohol for about 40 days. Patient currently has no insurance and no current provider. Patient reports that he received treatment for Bipolar disorder about two years ago at the [facility] in [place], [place]. Patient reports that he has also received substance abuse treatment in the past at the  [facility] about two years ago. Patient stated "the treatment was not good".   Patient reports drinking more than a case of beer daily, and reports smoking crack twice every two weeks. Patient stated that he has a long history of alcohol abuse (15 years). Patient reports smoking crack cocaine for about seven to eight years. Patient informed  CSW that he is actively having suicidal ideations and plans to overdose. Patient reports three suicide attempts in the past, last one being three weeks ago. Patient reports he attempted to "poison himself with cocaine". Prior suicidal attempts were about two years ago.   Patient was unsure of current medications, and unable to identify any strengths. Patient rates his mood at a one. Reporting that "he has nothing to live for".    Disposition  Disposition: Recommend Psych CSW Continuing To Support While In [facility] (CSW will round with Psych MD) Psych Md is recommending inpatient psych for the patient.

## 2015-03-06 LAB — CBC
HEMATOCRIT: 26.9 % — AB (ref 39.0–52.0)
HEMOGLOBIN: 9.6 g/dL — AB (ref 13.0–17.0)
MCH: 33.9 pg (ref 26.0–34.0)
MCHC: 35.7 g/dL (ref 30.0–36.0)
MCV: 95.1 fL (ref 78.0–100.0)
Platelets: 193 10*3/uL (ref 150–400)
RBC: 2.83 MIL/uL — AB (ref 4.22–5.81)
RDW: 14.6 % (ref 11.5–15.5)
WBC: 7.3 10*3/uL (ref 4.0–10.5)

## 2015-03-06 LAB — GLUCOSE, CAPILLARY: Glucose-Capillary: 103 mg/dL — ABNORMAL HIGH (ref 65–99)

## 2015-03-06 LAB — COMPREHENSIVE METABOLIC PANEL
ALBUMIN: 2.8 g/dL — AB (ref 3.5–5.0)
ALK PHOS: 92 U/L (ref 38–126)
ALT: 90 U/L — AB (ref 17–63)
AST: 59 U/L — AB (ref 15–41)
Anion gap: 6 (ref 5–15)
BILIRUBIN TOTAL: 0.4 mg/dL (ref 0.3–1.2)
BUN: 6 mg/dL (ref 6–20)
CO2: 29 mmol/L (ref 22–32)
CREATININE: 0.89 mg/dL (ref 0.61–1.24)
Calcium: 8.9 mg/dL (ref 8.9–10.3)
Chloride: 101 mmol/L (ref 101–111)
GFR calc Af Amer: >60 mL/min (ref >60)
GFR calc non Af Amer: >60 mL/min (ref >60)
GLUCOSE: 104 mg/dL — AB (ref 65–99)
POTASSIUM: 3.3 mmol/L — AB (ref 3.5–5.1)
Sodium: 136 mmol/L (ref 135–145)
TOTAL PROTEIN: 5.7 g/dL — AB (ref 6.5–8.1)

## 2015-03-06 MED ORDER — PANTOPRAZOLE SODIUM 40 MG PO TBEC: 40.0000 mg | DELAYED_RELEASE_TABLET | Freq: Two times a day (BID) | ORAL | Status: DC

## 2015-03-06 MED ORDER — CLARITHROMYCIN 500 MG PO TABS
500.0000 mg | ORAL_TABLET | Freq: Two times a day (BID) | ORAL | Status: DC
  Administered 2015-03-06 – 2015-03-10 (×9): 500 mg via ORAL
  Filled 2015-03-06 (×11): qty 1

## 2015-03-06 MED ORDER — AMOXICILLIN 500 MG PO CAPS
1000.0000 mg | ORAL_CAPSULE | Freq: Two times a day (BID) | ORAL | Status: DC
  Administered 2015-03-06 – 2015-03-10 (×9): 1000 mg via ORAL
  Filled 2015-03-06 (×10): qty 2

## 2015-03-06 MED ORDER — FOLIC ACID 1 MG PO TABS: 1.0000 mg | ORAL_TABLET | Freq: Every day | ORAL | Status: DC

## 2015-03-06 MED ORDER — POLYETHYLENE GLYCOL 3350 17 G PO PACK: 17.0000 g | PACK | Freq: Two times a day (BID) | ORAL | Status: DC

## 2015-03-06 MED ORDER — PANTOPRAZOLE SODIUM 40 MG PO TBEC
40.0000 mg | DELAYED_RELEASE_TABLET | Freq: Two times a day (BID) | ORAL | Status: DC
Start: 2015-03-06 — End: 2015-03-10
  Administered 2015-03-06 – 2015-03-10 (×9): 40 mg via ORAL
  Filled 2015-03-06 (×9): qty 1

## 2015-03-06 MED ORDER — POLYETHYLENE GLYCOL 3350 17 G PO PACK
17.0000 g | PACK | Freq: Two times a day (BID) | ORAL | Status: DC
  Administered 2015-03-06 – 2015-03-09 (×6): 17 g via ORAL
  Filled 2015-03-06 (×11): qty 1

## 2015-03-06 MED ORDER — OXYCODONE HCL 5 MG PO TABS: 5.0000 mg | ORAL_TABLET | ORAL | Status: DC | PRN

## 2015-03-06 MED ORDER — BISACODYL 10 MG RE SUPP
10.0000 mg | Freq: Once | RECTAL | Status: AC
  Administered 2015-03-06: 10 mg via RECTAL
  Filled 2015-03-06: qty 1

## 2015-03-06 MED ORDER — OXYCODONE HCL 5 MG PO TABS
5.0000 mg | ORAL_TABLET | ORAL | Status: DC | PRN
Start: 2015-03-06 — End: 2015-03-10
  Administered 2015-03-06 – 2015-03-10 (×11): 5 mg via ORAL
  Filled 2015-03-06 (×11): qty 1

## 2015-03-06 MED ORDER — CLARITHROMYCIN 500 MG PO TABS: 500.0000 mg | ORAL_TABLET | Freq: Two times a day (BID) | ORAL | Status: DC

## 2015-03-06 MED ORDER — FLUOXETINE HCL 20 MG PO CAPS
20.0000 mg | ORAL_CAPSULE | Freq: Every day | ORAL | Status: DC
  Administered 2015-03-06 – 2015-03-10 (×5): 20 mg via ORAL
  Filled 2015-03-06 (×5): qty 1

## 2015-03-06 MED ORDER — TRAZODONE HCL 50 MG PO TABS
50.0000 mg | ORAL_TABLET | Freq: Every day | ORAL | Status: DC
  Administered 2015-03-06 – 2015-03-09 (×4): 50 mg via ORAL
  Filled 2015-03-06 (×5): qty 1

## 2015-03-06 MED ORDER — CLONIDINE HCL 0.1 MG PO TABS: 0.1000 mg | ORAL_TABLET | Freq: Two times a day (BID) | ORAL | Status: DC

## 2015-03-06 MED ORDER — AMOXICILLIN 500 MG PO CAPS: 1000.0000 mg | ORAL_CAPSULE | Freq: Two times a day (BID) | ORAL | Status: DC

## 2015-03-06 NOTE — [facility] Counselor (Signed)
Pending review for possible placement with [facility].  

## 2015-03-06 NOTE — Discharge Summary (Addendum)
Physician Discharge Summary  Jeremy [name] HBZ:[id number] DOB: 06/24/1955 DOA: 02/28/2015  PCP: No primary care provider on file.  Admit date: 02/28/2015 Discharge date: 03/06/2015  Time spent:45 minutes  Recommendations for Outpatient Follow-up:  1. Inpatient Psychiatry 2. 5 mm cystic focus in the tail the pancreas may be related to the inflammatory change, needs FU  3. Gall bladder sludge needs FU with surgery 4. Needs PCP and needs Hepatitis vaccinations  Discharge Diagnoses:  Principal Problem:   Alcoholic Pancreatitis Active Problems:   Acute alcoholic hepatitis   Duodenal ulcers   Acute kidney injury ([place])   Alcohol abuse   Metabolic acidosis   Leukocytosis   Ketoacidosis   Lactic acidosis   Abnormal liver function tests   Shock liver   Melena   Protein-calorie malnutrition, severe ([place])   Hematemesis   Discharge Condition: stable  Diet recommendation: soft, low fat diet  Filed Weights   03/01/15 2306  Weight: 66.9 kg (147 lb 7.8 oz)    History of present illness:  Chief Complaint: "I've been throwing up and I keep falling, and my stomach hurts."  HPI: Jeremy [name] is a 59 y.o. male with a past medical history significant for Heavy Alcoholism, H/o RCC s/p L nephrectomy in 2009, homelessness,  who presented with 3 days abdominal pain, vomiting, and falling. The patient reported living on the streets and drinking 2 fifths of spirits daily for the last month. In the last few days he became weak and had repetitive falls. In the ED, the patient was tachycardic and in severe pain and tremors. He had elevated LFTs, acute kidney injury, elevated anion gap metabolic acidosis, glucose of 25 mg/dL, lactic acidosis greater than 4 mmol/L, leukocytosis, and alcohol level of 43 mg/dL, normal head CT, and a CT of the abdomen and pelvis showed pancreatitis.   Hospital Course:   Alcoholic pancreatitis -treated with supportive care, bowel rest, PPI -improving -ETOH cessation  counseled -Followed by [facility], MRCP did not show CBD stones, A 5 mm cystic focus towards the tail the pancreas may be related to the inflammatory change noted on MRCP, will need FU  Duodenal ulcers -treated with IV PPI BID, now taking PO -h.Pyori positive, changed to protonix BID -ETOh cessation counseled, amoxicillin, clarithromycin and Protonix ordered for H Pylori  Alcoholic hepatitis -admits to heavy daily ETOH abuse -initial labs with MELD of 16 and DF of 23, hence steroids not indicated -also component of ischemic injury suspected based on cocaine use and severity of LFT derrangement -hepatitis serologies negative , will need vaccinations as outpatient-educated about this -LFTs improving -MRCP without CBD stones  Gall bladder sludge Abdominal ultrasound did reveal the presence of gallbladder wall thickening to 4 mm with presence of sludge within the gallbladder. There was also some common bile duct dilatation, measuring 8 mm in the more distal portion.  - surgery was consulted, didn't think picture consistent with cholecystitis and more so with pancreatitis and hepatitis -recommended outpatient FU  Alcohol abuse with alcohol withdrawals  -Patient reporting drinking 2/5ths of liquor on a daily basis -treated with Ativan per CIWA protocol -improved  Acute kidney injury. -This is likely due to profound dehydration as he presented with a creatinine of 1.7 with BUN of 23.  -improved  Anion gap metabolic acidosis -Initial labs revealed a bicarbonate of 9 with anion gap of 36 -This is likely related to alcoholic ketoacidosis, AKI and starvation - resolved  Hypokalemia. -trend  HTN -on clonidine  Suicidal ideations -no plan but  feels hopeless and wants to end it all -had a sitter through hospitalization -psych consulted, inpatient psychiatry recommended  Thrombocytopenia -due to liver dz   Consultations: Psychiatry: Louretta Shorten, MD  Discharge Exam: Filed  Vitals:   03/06/15 0548  BP: 119/79  Pulse: 58  Temp: 97.7 F (36.5 C)  Resp: 16    General: AAOx3 Cardiovascular: S1S2/RRR Respiratory: CTAB  Discharge Instructions   Discharge Instructions    Diet - low sodium heart healthy    Complete by:  As directed      Increase activity slowly    Complete by:  As directed           Current Discharge Medication List    START taking these medications   Details  amoxicillin (AMOXIL) 500 MG capsule Take 2 capsules (1,000 mg total) by mouth every 12 (twelve) hours. For 14days Qty: 28 capsule, Refills: 0    clarithromycin (BIAXIN) 500 MG tablet Take 1 tablet (500 mg total) by mouth every 12 (twelve) hours. For 14days Qty: 28 tablet, Refills: 0    cloNIDine (CATAPRES) 0.1 MG tablet Take 1 tablet (0.1 mg total) by mouth 2 (two) times daily. Qty: 60 tablet, Refills: 0    folic acid (FOLVITE) 1 MG tablet Take 1 tablet (1 mg total) by mouth daily. Qty: 30 tablet, Refills: 0    oxyCODONE (OXY IR/ROXICODONE) 5 MG immediate release tablet Take 1 tablet (5 mg total) by mouth every 4 (four) hours as needed for moderate pain or severe pain. Qty: 30 tablet, Refills: 0    pantoprazole (PROTONIX) 40 MG tablet Take 1 tablet (40 mg total) by mouth 2 (two) times daily. Qty: 60 tablet, Refills: 0    polyethylene glycol (MIRALAX / GLYCOLAX) packet Take 17 g by mouth 2 (two) times daily. Qty: 14 each, Refills: 0       No Known Allergies Follow-up Information    Follow up with [facility].   Specialty:  General Surgery   Why:  As needed   Contact information:   [address] [place] [address] [phone]        The results of significant diagnostics from this hospitalization (including imaging, microbiology, ancillary and laboratory) are listed below for reference.    Significant Diagnostic Studies: Ct Head Wo Contrast  02/28/2015   CLINICAL DATA:  Blurred vision.  Alcohol use.  Inability to walk.  EXAM: CT  HEAD WITHOUT CONTRAST  CT CERVICAL SPINE WITHOUT CONTRAST  TECHNIQUE: Multidetector CT imaging of the head and cervical spine was performed following the standard protocol without intravenous contrast. Multiplanar CT image reconstructions of the cervical spine were also generated.  COMPARISON:  CT head and cervical spine 06/28/2007.  FINDINGS: CT HEAD FINDINGS  Premature for age cerebral and cerebellar atrophy. No evidence for acute infarction, hemorrhage, mass lesion, hydrocephalus, or extra-axial fluid.  Calvarium intact. No sinus or mastoid air fluid level. Cerebral volume loss since prior study of 2009.  CT CERVICAL SPINE FINDINGS  There is no visible cervical spine fracture, traumatic subluxation, prevertebral soft tissue swelling, or intraspinal hematoma. Reversal of the normal cervical lordotic curve nuchal ligament calcification. Disc space narrowing at C5-6 and C3-4, with widespread cervical facet disease. No neck masses. Minor atheromatous change at the carotid bifurcations. No visible pneumothorax. Staples seen in the LEFT lung apex.  IMPRESSION: Premature for age atrophy.  No acute intracranial findings.  Cervical spondylosis without fracture or traumatic subluxation.   Electronically Signed   By: Madie Reno  Curnes M.D.   On: 02/28/2015 16:31   Ct Cervical Spine Wo Contrast  02/28/2015   CLINICAL DATA:  Blurred vision.  Alcohol use.  Inability to walk.  EXAM: CT HEAD WITHOUT CONTRAST  CT CERVICAL SPINE WITHOUT CONTRAST  TECHNIQUE: Multidetector CT imaging of the head and cervical spine was performed following the standard protocol without intravenous contrast. Multiplanar CT image reconstructions of the cervical spine were also generated.  COMPARISON:  CT head and cervical spine 06/28/2007.  FINDINGS: CT HEAD FINDINGS  Premature for age cerebral and cerebellar atrophy. No evidence for acute infarction, hemorrhage, mass lesion, hydrocephalus, or extra-axial fluid.  Calvarium intact. No sinus or mastoid air  fluid level. Cerebral volume loss since prior study of 2009.  CT CERVICAL SPINE FINDINGS  There is no visible cervical spine fracture, traumatic subluxation, prevertebral soft tissue swelling, or intraspinal hematoma. Reversal of the normal cervical lordotic curve nuchal ligament calcification. Disc space narrowing at C5-6 and C3-4, with widespread cervical facet disease. No neck masses. Minor atheromatous change at the carotid bifurcations. No visible pneumothorax. Staples seen in the LEFT lung apex.  IMPRESSION: Premature for age atrophy.  No acute intracranial findings.  Cervical spondylosis without fracture or traumatic subluxation.   Electronically Signed   By: Staci Righter M.D.   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 LEFT nephrectomy for renal cell carcinoma.  EXAM: ULTRASOUND ABDOMEN COMPLETE  COMPARISON:  CT 02/28/2015  FINDINGS: Gallbladder: Gallbladder wall thickening to 4 mm. There is small amount pericholecystic fluid. The gallbladder is mildly distended at 4.6 cm. There is sludge within lumen gallbladder. Sonographic Murphy sign cannot be assessed due to patient sedation.  Common bile duct: Diameter: Proximal common bile duct measures 4 mm. The more distal common bile duct measures 8 mm.  Liver: Heterogeneous liver echotexture. No focal lesion. No duct dilatation.  IVC: No abnormality visualized.  Pancreas: Mild dilatation of the pancreatic duct.  Spleen: Size and appearance within normal limits.  Right Kidney: Length: 11.6. Small amount of perinephric fluid. Echogenicity within normal limits. No mass or hydronephrosis visualized.  Left Kidney: Length: Surgically absent. Echogenicity within normal limits. No mass or hydronephrosis visualized.  Abdominal aorta: No aneurysm visualized.  Other findings: Small volume ascites.  IMPRESSION: 1. Gallbladder wall thickening with sludge and pericholecystic  fluid is concerning for acute cholecystitis. Small amount ascites from liver dysfunction could contribute to the pericholecystic fluid. Consider nuclear medicine HIDA scan if clinical picture is equivocal. 2. Common bile duct is dilated to a greater degree than CT 1 day prior. This may relate to the pancreatitis or choledocholithiasis. 3. Mild prominence the pancreatic duct is similar to comparison CT. These results will be called to the ordering clinician or representative by the Radiologist Assistant, and communication documented in the PACS or zVision Dashboard.   Electronically Signed   By: Suzy Bouchard M.D.   On: 03/01/2015 12:36   Ct Abdomen Pelvis W Contrast  02/28/2015   CLINICAL DATA:  Abdominal pain, vomiting and generalized weakness. Alcohol intoxication. Fall.  EXAM: CT ABDOMEN AND PELVIS WITH CONTRAST  TECHNIQUE: Multidetector CT imaging of the abdomen and pelvis was performed using the standard protocol following bolus administration of intravenous contrast.  CONTRAST:  11mL OMNIPAQUE IOHEXOL 300 MG/ML  SOLN  COMPARISON:  12/22/2007 CT abdomen/pelvis.  FINDINGS: Lower chest: No significant pulmonary nodules or acute consolidative airspace disease.  Hepatobiliary: Diffuse hepatic steatosis. No  liver mass. Distended gallbladder containing layering calcified gallstones measuring up to the 5 mm. Top-normal gallbladder wall thickness. No biliary ductal dilatation.  Pancreas: There is prominent fat stranding and ill-defined fluid surrounding the entire length of the mildly thickened pancreas, in keeping with acute pancreatitis. No pancreatic parenchymal calcifications. No pancreatic mass. Top-normal caliber pancreatic duct. No focal measurable pancreatic or peripancreatic fluid collections. No areas of pancreatic parenchymal nonenhancement or gas.  Spleen: Normal size. No mass.  Adrenals/Urinary Tract: Normal adrenals. Status post left nephrectomy, with no mass or fluid collection in the left  nephrectomy bed. Normal right kidney, with no right hydronephrosis and no right renal mass. Normal bladder.  Stomach/Bowel: Grossly normal stomach. Normal caliber small bowel with no small bowel wall thickening. Appendix is not discretely visualized. Normal large bowel with no diverticulosis, large bowel wall thickening or pericolonic fat stranding.  Vascular/Lymphatic: Atherosclerotic nonaneurysmal abdominal aorta. Patent portal, splenic, hepatic and right renal veins. No pathologically enlarged lymph nodes in the abdomen or pelvis.  Reproductive: Mild prostatomegaly with nonspecific internal prostatic calcification.  Other: No pneumoperitoneum, ascites or focal fluid collection.  Musculoskeletal: No aggressive appearing focal osseous lesions.  IMPRESSION: 1. Acute uncomplicated pancreatitis. 2. Cholelithiasis. Distended gallbladder. No specific findings to suggest acute cholecystitis. 3. No biliary ductal dilatation. 4. Diffuse hepatic steatosis. 5. Status post left nephrectomy, with no abnormal findings in the left nephrectomy bed.   Electronically Signed   By: Ilona Sorrel M.D.   On: 02/28/2015 19:37   Mr 3d Recon At Scanner  03/05/2015   CLINICAL DATA:  Subsequent encounter for abdominal pain and swelling with elevated LFTs. Patient with history of pancreatitis  EXAM: MRI ABDOMEN WITHOUT AND WITH CONTRAST (INCLUDING MRCP)  TECHNIQUE: Multiplanar multisequence MR imaging of the abdomen was performed both before and after the administration of intravenous contrast. Heavily T2-weighted images of the biliary and pancreatic ducts were obtained, and three-dimensional MRCP images were rendered by post processing.  CONTRAST:  67mL MULTIHANCE GADOBENATE DIMEGLUMINE 529 MG/ML IV SOLN  COMPARISON:  CT scan from 02/28/2015  FINDINGS: Lower chest:  Unremarkable.  Hepatobiliary: No focal abnormality is seen in the liver parenchyma. No focal enhancing lesion. Gallbladder is distended with layering sludge and at least 1 at  gallstone measuring in the 5 mm size range. There appears to be a trace amount of pericholecystic fluid. No substantial gallbladder wall thickening. No intrahepatic biliary duct dilatation. No dilatation of the extrahepatic bile ducts. No evidence for choledocholithiasis.  Pancreas: Diffuse fullness of pancreatic tissue again noted, similar to recent CT scan. There is enhancement of the parenchyma throughout with no features to suggest pancreatic necrosis. No dilatation of the main pancreatic duct. The 5 mm cystic focus is identified in the pancreas near the junction of the body and tail (see image 27 series 3 and image 50 series 1102). A small amount of peripancreatic edema persists. No evidence for pseudocyst. No evidence for retroperitoneal abscess.  Spleen: No splenomegaly. No focal mass lesion.  Adrenals/Urinary Tract: The adrenal glands are normal bilaterally. Left kidney is surgically absent. Right kidney has normal MR imaging features.  Stomach/Bowel: Stomach is nondistended. No gastric wall thickening. No evidence of outlet obstruction. Duodenum is normally positioned as is the ligament of Treitz.  Vascular/Lymphatic: No abdominal aortic aneurysm. The portal vein, superior mesenteric vein, and splenic vein are patent. There is no gastrohepatic or hepatoduodenal ligament lymphadenopathy. No intraperitoneal or retroperitoneal lymphadenopy.  Other: Trace edema is seen in the retroperitoneal tissues around the pancreas. No substantial  intraperitoneal free fluid.  Musculoskeletal: No abnormal marrow signal within the visualized bony anatomy.  IMPRESSION: 1. Cholelithiasis without evidence for choledocholithiasis. There is a trace amount of fluid adjacent to the gallbladder, but no overt gallbladder wall thickening to suggest acute cholecystitis. 2. Mild peripancreatic edema without pancreatic necrosis, pseudocyst, or retroperitoneal abscess. A 5 mm cystic focus towards the tail the pancreas may be related to the  inflammatory change. No dilatation of the main pancreatic duct. 3. Status post left nephrectomy.   Electronically Signed   By: Misty Stanley M.D.   On: 03/05/2015 08:29   Dg Abd Acute W/chest  02/28/2015   CLINICAL DATA:  Ethanol abuse. Abdominal pain. Generalized weakness.  EXAM: DG ABDOMEN ACUTE W/ 1V CHEST  COMPARISON:  Radiograph 04/30/2008  FINDINGS: Normal cardiac silhouette with ectatic aorta. There is postsurgical change in the LEFT upper lobe along the mediastinum. No effusion, infiltrate, or pneumothorax.  LEFT lower decubitus view demonstrates no intraperitoneal free air. AP view demonstrates no dilated loops of large or small bowel. There is gas the rectum. No pathologic calcifications. No organomegaly.  IMPRESSION: 1.  No acute cardiopulmonary process. 2. No evidence of bowel obstruction or intraperitoneal free air.   Electronically Signed   By: Suzy Bouchard M.D.   On: 02/28/2015 16:45   Mr Abd W/wo Cm/mrcp  03/05/2015   CLINICAL DATA:  Subsequent encounter for abdominal pain and swelling with elevated LFTs. Patient with history of pancreatitis  EXAM: MRI ABDOMEN WITHOUT AND WITH CONTRAST (INCLUDING MRCP)  TECHNIQUE: Multiplanar multisequence MR imaging of the abdomen was performed both before and after the administration of intravenous contrast. Heavily T2-weighted images of the biliary and pancreatic ducts were obtained, and three-dimensional MRCP images were rendered by post processing.  CONTRAST:  47mL MULTIHANCE GADOBENATE DIMEGLUMINE 529 MG/ML IV SOLN  COMPARISON:  CT scan from 02/28/2015  FINDINGS: Lower chest:  Unremarkable.  Hepatobiliary: No focal abnormality is seen in the liver parenchyma. No focal enhancing lesion. Gallbladder is distended with layering sludge and at least 1 at gallstone measuring in the 5 mm size range. There appears to be a trace amount of pericholecystic fluid. No substantial gallbladder wall thickening. No intrahepatic biliary duct dilatation. No dilatation of  the extrahepatic bile ducts. No evidence for choledocholithiasis.  Pancreas: Diffuse fullness of pancreatic tissue again noted, similar to recent CT scan. There is enhancement of the parenchyma throughout with no features to suggest pancreatic necrosis. No dilatation of the main pancreatic duct. The 5 mm cystic focus is identified in the pancreas near the junction of the body and tail (see image 27 series 3 and image 50 series 1102). A small amount of peripancreatic edema persists. No evidence for pseudocyst. No evidence for retroperitoneal abscess.  Spleen: No splenomegaly. No focal mass lesion.  Adrenals/Urinary Tract: The adrenal glands are normal bilaterally. Left kidney is surgically absent. Right kidney has normal MR imaging features.  Stomach/Bowel: Stomach is nondistended. No gastric wall thickening. No evidence of outlet obstruction. Duodenum is normally positioned as is the ligament of Treitz.  Vascular/Lymphatic: No abdominal aortic aneurysm. The portal vein, superior mesenteric vein, and splenic vein are patent. There is no gastrohepatic or hepatoduodenal ligament lymphadenopathy. No intraperitoneal or retroperitoneal lymphadenopy.  Other: Trace edema is seen in the retroperitoneal tissues around the pancreas. No substantial intraperitoneal free fluid.  Musculoskeletal: No abnormal marrow signal within the visualized bony anatomy.  IMPRESSION: 1. Cholelithiasis without evidence for choledocholithiasis. There is a trace amount of fluid adjacent to the gallbladder,  but no overt gallbladder wall thickening to suggest acute cholecystitis. 2. Mild peripancreatic edema without pancreatic necrosis, pseudocyst, or retroperitoneal abscess. A 5 mm cystic focus towards the tail the pancreas may be related to the inflammatory change. No dilatation of the main pancreatic duct. 3. Status post left nephrectomy.   Electronically Signed   By: Misty Stanley M.D.   On: 03/05/2015 08:29    Microbiology: Recent Results  (from the past 240 hour(s))  MRSA PCR Screening     Status: None   Collection Time: 02/28/15 11:31 PM  Result Value Ref Range Status   MRSA by PCR NEGATIVE NEGATIVE Final    Comment:        The GeneXpert MRSA Assay (FDA approved for NASAL specimens only), is one component of a comprehensive MRSA colonization surveillance program. It is not intended to diagnose MRSA infection nor to guide or monitor treatment for MRSA infections.   C difficile quick scan w PCR reflex     Status: Abnormal   Collection Time: 03/01/15  3:30 PM  Result Value Ref Range Status   C Diff antigen POSITIVE (A) NEGATIVE Final   C Diff toxin NEGATIVE NEGATIVE Final   C Diff interpretation   Final    C. difficile present, but toxin not detected. This indicates colonization. In most cases, this does not require treatment. If patient has signs and symptoms consistent with colitis, consider treatment. Requires ENTERIC precautions.     Labs: Basic Metabolic Panel:  Recent Labs Lab 03/02/15 1209 03/03/15 0358 03/04/15 0540 03/05/15 0610 03/06/15 0545  NA 135 137 137 136 136  K 3.4* 3.0* 4.0 3.7 3.3*  CL 106 106 107 101 101  CO2 25 25 23 27 29   GLUCOSE 113* 92 105* 102* 104*  BUN <5* <5* 5* <5* 6  CREATININE 0.80 0.76 0.83 0.84 0.89  CALCIUM 7.6* 7.9* 8.8* 9.0 8.9   Liver Function Tests:  Recent Labs Lab 03/02/15 1209 03/03/15 0358 03/04/15 0540 03/05/15 0610 03/06/15 0545  AST 718* 409* 196* 107* 59*  ALT 250* 202* 146* 118* 90*  ALKPHOS 126 119 111 105 92  BILITOT 0.9 0.8 0.8 0.4 0.4  PROT 5.0* 5.4* 5.4* 5.7* 5.7*  ALBUMIN 2.7* 2.8* 2.6* 2.8* 2.8*    Recent Labs Lab 02/28/15 1700 03/02/15 0351 03/03/15 0358 03/04/15 0540  LIPASE 151* 392* 197* 291*   No results for input(s): AMMONIA in the last 168 hours. CBC:  Recent Labs Lab 02/28/15 1700  03/02/15 1209 03/03/15 0358 03/04/15 0540 03/05/15 0610 03/06/15 0545  WBC 22.4*  < > 7.4 7.6 7.6 6.9 7.3  NEUTROABS 19.3*  --   --    --   --   --   --   HGB 13.0  < > 9.1* 9.1* 9.2* 9.8* 9.6*  HCT 37.8*  < > 25.9* 25.8* 26.5* 27.7* 26.9*  MCV 98.2  < > 94.2 95.6 95.7 95.5 95.1  PLT 104*  < > 51* 51* 79* 124* 193  < > = values in this interval not displayed. Cardiac Enzymes: No results for input(s): CKTOTAL, CKMB, CKMBINDEX, TROPONINI in the last 168 hours. BNP: BNP (last 3 results) No results for input(s): BNP in the last 8760 hours.  ProBNP (last 3 results) No results for input(s): PROBNP in the last 8760 hours.  CBG:  Recent Labs Lab 03/02/15 0746 03/03/15 0758 03/04/15 0821 03/05/15 0742 03/06/15 0738  GLUCAP 153* 80 102* 98 103*       Signed:  Domenic Polite  [facility] 03/06/2015, 1:02 PM

## 2015-03-06 NOTE — Progress Notes (Signed)
Pt recommended for inpatient tx.   Pt referred to [facility] Pt referred to [facility]  Pt referred to [facility]-at capacity  Pt referred to [facility]-left message
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
## 2015-03-07 LAB — COMPREHENSIVE METABOLIC PANEL
ALBUMIN: 2.9 g/dL — AB (ref 3.5–5.0)
ALT: 73 U/L — ABNORMAL HIGH (ref 17–63)
ANION GAP: 5 (ref 5–15)
AST: 44 U/L — AB (ref 15–41)
Alkaline Phosphatase: 87 U/L (ref 38–126)
BUN: 11 mg/dL (ref 6–20)
CHLORIDE: 104 mmol/L (ref 101–111)
CO2: 30 mmol/L (ref 22–32)
Calcium: 8.7 mg/dL — ABNORMAL LOW (ref 8.9–10.3)
Creatinine, Ser: 1.06 mg/dL (ref 0.61–1.24)
GFR calc Af Amer: >60 mL/min (ref >60)
GFR calc non Af Amer: >60 mL/min (ref >60)
GLUCOSE: 107 mg/dL — AB (ref 65–99)
POTASSIUM: 3.5 mmol/L (ref 3.5–5.1)
SODIUM: 139 mmol/L (ref 135–145)
TOTAL PROTEIN: 5.8 g/dL — AB (ref 6.5–8.1)
Total Bilirubin: 0.4 mg/dL (ref 0.3–1.2)

## 2015-03-07 LAB — CBC
HEMATOCRIT: 26.7 % — AB (ref 39.0–52.0)
Hemoglobin: 9.2 g/dL — ABNORMAL LOW (ref 13.0–17.0)
MCH: 33 pg (ref 26.0–34.0)
MCHC: 34.5 g/dL (ref 30.0–36.0)
MCV: 95.7 fL (ref 78.0–100.0)
PLATELETS: 249 10*3/uL (ref 150–400)
RBC: 2.79 MIL/uL — ABNORMAL LOW (ref 4.22–5.81)
RDW: 15.1 % (ref 11.5–15.5)
WBC: 7.1 10*3/uL (ref 4.0–10.5)

## 2015-03-07 MED ORDER — DIPHENHYDRAMINE HCL 25 MG PO CAPS
25.0000 mg | ORAL_CAPSULE | Freq: Four times a day (QID) | ORAL | Status: DC | PRN
  Administered 2015-03-07: 25 mg via ORAL
  Filled 2015-03-07: qty 1

## 2015-03-07 NOTE — Progress Notes (Signed)
Pt seen and examined, no changes from DC summary 10/6  Domenic Polite, [phone]

## 2015-03-07 NOTE — Clinical Social Work Note (Signed)
[facility] is not willing to take pt.  [facility] stated pt does not meet inpatient criteria.  Pt is currently under review at [facility].  CSW will continue to follow and seek placement.  [place], [place]

## 2015-03-08 LAB — GLUCOSE, CAPILLARY: Glucose-Capillary: 88 mg/dL (ref 65–99)

## 2015-03-08 NOTE — Progress Notes (Signed)
CSW completed patient referrals to the following inpatient Geri-Psych facilities:  [facility]  CSW will continue to follow patient for placement needs.  [place] Disposition CSW [phone]

## 2015-03-08 NOTE — Progress Notes (Signed)
No changes from DC summary, remains stable for DC, awaiting Bed, CSW following  Domenic Polite

## 2015-03-08 NOTE — Clinical Social Work Note (Signed)
CSW spoke with [facility] requesting assistance with faxing pt out to facilities.  CSW called and spoke with [facility] to see if they had a bed for pt today.  [facility] stated they have some pt's in ED that might need placement but will call CSW back if pt has a bed today.  CSW provided cell phone number for call back.   Dede Query, LCSW [place] Worker - Weekend Coverage cell #: [phone]

## 2015-03-09 ENCOUNTER — Observation Stay (HOSPITAL_COMMUNITY)
Admission: AD | Admit: 2015-03-09 | Payer: Federal, State, Local not specified - Other | Source: Intra-hospital | Admitting: Psychiatry

## 2015-03-09 LAB — GLUCOSE, CAPILLARY: GLUCOSE-CAPILLARY: 130 mg/dL — AB (ref 65–99)

## 2015-03-09 NOTE — Clinical Social Work Note (Signed)
CSW contacted the following facilities to obtain pt psych bed:  [facility]: at capacity until Monday, still on waiting list  [facility]:  No beds, Still on waiting list  [facility]:  Pt too acute no med surge unit   [facility]:  Left a message to receive a call back  [facility]:  Does not do alcohol   [facility]:  Left a message to receive a call back  [facility]:  At capacity  .Dede Query, LCSW [facility] Clinical Social Worker - Weekend Coverage cell #: [phone]

## 2015-03-09 NOTE — Progress Notes (Signed)
AC spoke with Dr. Louretta Shorten who agreed that patient would be appropriate for [facility] OBS unit.  Patient could work on long-term SA treatment as plan.  CSW faxed over Voluntary OBS consent form to CSW at [facility].   [place] Disposition CSW [phone]

## 2015-03-09 NOTE — Clinical Social Work Note (Signed)
CSW provided consent to pt who signed and is in agreement with [facility] unit and look for long term substance abuse in patient.   Spoke with RN to help clarify the contact isolation and she stated that pt has had no stool for 2 days and has been treated since the 1st.  RN called MD to clarify if pt can come off contact and MD feels more comfortable having infectious disease consulted which will not take place until tomorrow.  [facility] will take pt tomorrow in OBS unit if infectious disease clears him.  Dede Query, LCSW [place] Worker - Weekend Coverage cell #: [phone]

## 2015-03-09 NOTE — Progress Notes (Signed)
Stable, no changes, Still awaiting Psych placement  Domenic Polite, MD [phone]

## 2015-03-09 NOTE — Clinical Social Work Note (Signed)
CSW faxed [facility] Observation unit consent to treat form to [facility].  When pt is cleared by infectious disease tomorrow pt can go over to [facility] obs unit.  Please follow up with AC at [phone]  .Dede Query, LCSW [facility] Clinical Social Worker - Weekend Coverage cell #: [phone]

## 2015-03-10 ENCOUNTER — Encounter (HOSPITAL_COMMUNITY): Payer: Self-pay

## 2015-03-10 ENCOUNTER — Inpatient Hospital Stay (HOSPITAL_COMMUNITY)
Admission: AD | Admit: 2015-03-10 | Discharge: 2015-03-17 | DRG: 885 | Disposition: A | Discharge status: Home / Self Care | Payer: No Typology Code available for payment source | Source: Intra-hospital | Attending: Psychiatry | Admitting: Psychiatry

## 2015-03-10 DIAGNOSIS — F1410 Cocaine abuse, uncomplicated: Secondary | ICD-10-CM | POA: Diagnosis present

## 2015-03-10 DIAGNOSIS — F329 Major depressive disorder, single episode, unspecified: Secondary | ICD-10-CM | POA: Diagnosis present

## 2015-03-10 DIAGNOSIS — Y902 Blood alcohol level of 40-59 mg/100 ml: Secondary | ICD-10-CM | POA: Diagnosis present

## 2015-03-10 DIAGNOSIS — Z590 Homelessness: Secondary | ICD-10-CM

## 2015-03-10 DIAGNOSIS — F1020 Alcohol dependence, uncomplicated: Secondary | ICD-10-CM | POA: Diagnosis present

## 2015-03-10 DIAGNOSIS — R45851 Suicidal ideations: Secondary | ICD-10-CM | POA: Diagnosis present

## 2015-03-10 DIAGNOSIS — Z905 Acquired absence of kidney: Secondary | ICD-10-CM | POA: Diagnosis not present

## 2015-03-10 DIAGNOSIS — Z902 Acquired absence of lung [part of]: Secondary | ICD-10-CM | POA: Diagnosis not present

## 2015-03-10 DIAGNOSIS — F332 Major depressive disorder, recurrent severe without psychotic features: Principal | ICD-10-CM | POA: Diagnosis present

## 2015-03-10 DIAGNOSIS — Z87891 Personal history of nicotine dependence: Secondary | ICD-10-CM

## 2015-03-10 LAB — GLUCOSE, CAPILLARY: GLUCOSE-CAPILLARY: 99 mg/dL (ref 65–99)

## 2015-03-10 MED ORDER — POLYETHYLENE GLYCOL 3350 17 G PO PACK: 17.0000 g | PACK | Freq: Every day | ORAL | Status: DC | PRN

## 2015-03-10 MED ORDER — FLUOXETINE HCL 20 MG PO CAPS: 20.0000 mg | ORAL_CAPSULE | Freq: Every day | ORAL | Status: DC

## 2015-03-10 MED ORDER — TRAZODONE HCL 50 MG PO TABS: 50.0000 mg | ORAL_TABLET | Freq: Every day | ORAL | Status: DC

## 2015-03-10 MED ORDER — MAGNESIUM HYDROXIDE 400 MG/5ML PO SUSP
30.0000 mL | Freq: Every day | ORAL | Status: DC | PRN
  Administered 2015-03-10: 30 mL via ORAL
  Filled 2015-03-10: qty 30

## 2015-03-10 MED ORDER — ENSURE ENLIVE PO LIQD
237.0000 mL | Freq: Two times a day (BID) | ORAL | Status: DC
  Administered 2015-03-11 – 2015-03-16 (×4): 237 mL via ORAL

## 2015-03-10 MED ORDER — ALUM & MAG HYDROXIDE-SIMETH 200-200-20 MG/5ML PO SUSP: 30.0000 mL | ORAL | Status: DC | PRN

## 2015-03-10 MED ORDER — ACETAMINOPHEN 325 MG PO TABS: 650.0000 mg | ORAL_TABLET | Freq: Four times a day (QID) | ORAL | Status: DC | PRN

## 2015-03-10 NOTE — Discharge Summary (Addendum)
Physician Discharge Summary  Jeremy [name] NWG:[id number] DOB: 09/12/1955 DOA: 02/28/2015  PCP: No primary care provider on file.  Admit date: 02/28/2015 Discharge date: 03/10/2015  Time spent:45 minutes  Recommendations for Outpatient Follow-up:  1. Inpatient Psychiatry 2. 5 mm cystic focus in the tail the pancreas may be related to the inflammatory change, needs FU  3. Gall bladder sludge needs FU with surgery 4. Needs PCP and needs Hepatitis vaccinations  Discharge Diagnoses:  Principal Problem:   Alcoholic Pancreatitis Active Problems:   Acute alcoholic hepatitis   Duodenal ulcers   Acute kidney injury ([place])   Alcohol abuse   Metabolic acidosis   Leukocytosis   Ketoacidosis   Lactic acidosis   Abnormal liver function tests   Shock liver   Melena   Protein-calorie malnutrition, severe ([place])   Hematemesis   Discharge Condition: stable  Diet recommendation: soft, low fat diet  Filed Weights   03/01/15 2306  Weight: 66.9 kg (147 lb 7.8 oz)    History of present illness:  Chief Complaint: "I've been throwing up and I keep falling, and my stomach hurts."  HPI: Jeremy [name] is a 59 y.o. male with a past medical history significant for Heavy Alcoholism, H/o RCC s/p L nephrectomy in 2009, homelessness,  who presented with 3 days abdominal pain, vomiting, and falling. The patient reported living on the streets and drinking 2 fifths of spirits daily for the last month. In the last few days he became weak and had repetitive falls. In the ED, the patient was tachycardic and in severe pain and tremors. He had elevated LFTs, acute kidney injury, elevated anion gap metabolic acidosis, glucose of 25 mg/dL, lactic acidosis greater than 4 mmol/L, leukocytosis, and alcohol level of 43 mg/dL, normal head CT, and a CT of the abdomen and pelvis showed pancreatitis.   Hospital Course:   Alcoholic pancreatitis -treated with supportive care, bowel rest, PPI -improving -ETOH cessation  counseled -Followed by [facility], MRCP did not show CBD stones, A 5 mm cystic focus towards the tail the pancreas may be related to the inflammatory change noted on MRCP, will need FU  Duodenal ulcers -treated with IV PPI BID, now taking PO -h.Pyori positive, changed to protonix BID -ETOh cessation counseled, amoxicillin, clarithromycin and Protonix ordered for H Pylori  Alcoholic hepatitis -admits to heavy daily ETOH abuse -initial labs with MELD of 16 and DF of 23, hence steroids not indicated -also component of ischemic injury suspected based on cocaine use and severity of LFT derrangement -hepatitis serologies negative , will need vaccinations as outpatient-educated about this -LFTs improving -MRCP without CBD stones  Gall bladder sludge Abdominal ultrasound did reveal the presence of gallbladder wall thickening to 4 mm with presence of sludge within the gallbladder. There was also some common bile duct dilatation, measuring 8 mm in the more distal portion.  - surgery was consulted, didn't think picture consistent with cholecystitis and more so with pancreatitis and hepatitis -recommended outpatient FU  Alcohol abuse with alcohol withdrawals  -Patient reporting drinking 2/5ths of liquor on a daily basis -treated with Ativan per CIWA protocol -improved  Acute kidney injury. -This is likely due to profound dehydration as he presented with a creatinine of 1.7 with BUN of 23.  -improved  Anion gap metabolic acidosis -Initial labs revealed a bicarbonate of 9 with anion gap of 36 -This is likely related to alcoholic ketoacidosis, AKI and starvation - resolved  Hypokalemia. -trend  HTN -on clonidine  Suicidal ideations -no plan but  feels hopeless and wants to end it all -had a sitter through hospitalization -psych consulted, inpatient psychiatry recommended -started on Prozac and Trazodone for sleep  Thrombocytopenia -due to liver dz  Cdiff antigen positive but  toxin negative -no infection, just colonization, in fact has been requiring laxatives to have BM   Consultations: Psychiatry: Louretta Shorten, MD  Discharge Exam: Filed Vitals:   03/10/15 1500  BP: 104/68  Pulse: 63  Temp: 97.8 F (36.6 C)  Resp: 18    General: AAOx3 Cardiovascular: S1S2/RRR Respiratory: CTAB  Discharge Instructions   Discharge Instructions    Diet - low sodium heart healthy    Complete by:  As directed      Increase activity slowly    Complete by:  As directed           Current Discharge Medication List    START taking these medications   Details  amoxicillin (AMOXIL) 500 MG capsule Take 2 capsules (1,000 mg total) by mouth every 12 (twelve) hours. For 14days Qty: 28 capsule, Refills: 0    clarithromycin (BIAXIN) 500 MG tablet Take 1 tablet (500 mg total) by mouth every 12 (twelve) hours. For 14days Qty: 28 tablet, Refills: 0    cloNIDine (CATAPRES) 0.1 MG tablet Take 1 tablet (0.1 mg total) by mouth 2 (two) times daily. Qty: 60 tablet, Refills: 0    FLUoxetine (PROZAC) 20 MG capsule Take 1 capsule (20 mg total) by mouth daily. Refills: 3    folic acid (FOLVITE) 1 MG tablet Take 1 tablet (1 mg total) by mouth daily. Qty: 30 tablet, Refills: 0    oxyCODONE (OXY IR/ROXICODONE) 5 MG immediate release tablet Take 1 tablet (5 mg total) by mouth every 4 (four) hours as needed for moderate pain or severe pain. Qty: 30 tablet, Refills: 0    pantoprazole (PROTONIX) 40 MG tablet Take 1 tablet (40 mg total) by mouth 2 (two) times daily. Qty: 60 tablet, Refills: 0    polyethylene glycol (MIRALAX / GLYCOLAX) packet Take 17 g by mouth daily as needed for moderate constipation. Qty: 14 each, Refills: 0    traZODone (DESYREL) 50 MG tablet Take 1 tablet (50 mg total) by mouth at bedtime.       No Known Allergies Follow-up Information    Follow up with [facility].   Specialty:  General Surgery   Why:  As needed   Contact information:    [place] [id number] [phone]        The results of significant diagnostics from this hospitalization (including imaging, microbiology, ancillary and laboratory) are listed below for reference.    Significant Diagnostic Studies: Ct Head Wo Contrast  02/28/2015   CLINICAL DATA:  Blurred vision.  Alcohol use.  Inability to walk.  EXAM: CT HEAD WITHOUT CONTRAST  CT CERVICAL SPINE WITHOUT CONTRAST  TECHNIQUE: Multidetector CT imaging of the head and cervical spine was performed following the standard protocol without intravenous contrast. Multiplanar CT image reconstructions of the cervical spine were also generated.  COMPARISON:  CT head and cervical spine 06/28/2007.  FINDINGS: CT HEAD FINDINGS  Premature for age cerebral and cerebellar atrophy. No evidence for acute infarction, hemorrhage, mass lesion, hydrocephalus, or extra-axial fluid.  Calvarium intact. No sinus or mastoid air fluid level. Cerebral volume loss since prior study of 2009.  CT CERVICAL SPINE FINDINGS  There is no visible cervical spine fracture, traumatic subluxation, prevertebral soft tissue swelling, or intraspinal hematoma. Reversal of the normal  cervical lordotic curve nuchal ligament calcification. Disc space narrowing at C5-6 and C3-4, with widespread cervical facet disease. No neck masses. Minor atheromatous change at the carotid bifurcations. No visible pneumothorax. Staples seen in the LEFT lung apex.  IMPRESSION: Premature for age atrophy.  No acute intracranial findings.  Cervical spondylosis without fracture or traumatic subluxation.   Electronically Signed   By: Staci Righter M.D.   On: 02/28/2015 16:31   Ct Cervical Spine Wo Contrast  02/28/2015   CLINICAL DATA:  Blurred vision.  Alcohol use.  Inability to walk.  EXAM: CT HEAD WITHOUT CONTRAST  CT CERVICAL SPINE WITHOUT CONTRAST  TECHNIQUE: Multidetector CT imaging of the head and cervical spine was performed following the standard protocol  without intravenous contrast. Multiplanar CT image reconstructions of the cervical spine were also generated.  COMPARISON:  CT head and cervical spine 06/28/2007.  FINDINGS: CT HEAD FINDINGS  Premature for age cerebral and cerebellar atrophy. No evidence for acute infarction, hemorrhage, mass lesion, hydrocephalus, or extra-axial fluid.  Calvarium intact. No sinus or mastoid air fluid level. Cerebral volume loss since prior study of 2009.  CT CERVICAL SPINE FINDINGS  There is no visible cervical spine fracture, traumatic subluxation, prevertebral soft tissue swelling, or intraspinal hematoma. Reversal of the normal cervical lordotic curve nuchal ligament calcification. Disc space narrowing at C5-6 and C3-4, with widespread cervical facet disease. No neck masses. Minor atheromatous change at the carotid bifurcations. No visible pneumothorax. Staples seen in the LEFT lung apex.  IMPRESSION: Premature for age atrophy.  No acute intracranial findings.  Cervical spondylosis without fracture or traumatic subluxation.   Electronically Signed   By: Staci Righter M.D.   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 LEFT nephrectomy for renal cell carcinoma.  EXAM: ULTRASOUND ABDOMEN COMPLETE  COMPARISON:  CT 02/28/2015  FINDINGS: Gallbladder: Gallbladder wall thickening to 4 mm. There is small amount pericholecystic fluid. The gallbladder is mildly distended at 4.6 cm. There is sludge within lumen gallbladder. Sonographic Murphy sign cannot be assessed due to patient sedation.  Common bile duct: Diameter: Proximal common bile duct measures 4 mm. The more distal common bile duct measures 8 mm.  Liver: Heterogeneous liver echotexture. No focal lesion. No duct dilatation.  IVC: No abnormality visualized.  Pancreas: Mild dilatation of the pancreatic duct.  Spleen: Size and appearance within normal limits.  Right Kidney: Length: 11.6.  Small amount of perinephric fluid. Echogenicity within normal limits. No mass or hydronephrosis visualized.  Left Kidney: Length: Surgically absent. Echogenicity within normal limits. No mass or hydronephrosis visualized.  Abdominal aorta: No aneurysm visualized.  Other findings: Small volume ascites.  IMPRESSION: 1. Gallbladder wall thickening with sludge and pericholecystic fluid is concerning for acute cholecystitis. Small amount ascites from liver dysfunction could contribute to the pericholecystic fluid. Consider nuclear medicine HIDA scan if clinical picture is equivocal. 2. Common bile duct is dilated to a greater degree than CT 1 day prior. This may relate to the pancreatitis or choledocholithiasis. 3. Mild prominence the pancreatic duct is similar to comparison CT. These results will be called to the ordering clinician or representative by the Radiologist Assistant, and communication documented in the PACS or zVision Dashboard.   Electronically Signed   By: Suzy Bouchard M.D.   On: 03/01/2015 12:36   Ct Abdomen Pelvis W Contrast  02/28/2015   CLINICAL DATA:  Abdominal pain, vomiting and generalized weakness. Alcohol intoxication.  Fall.  EXAM: CT ABDOMEN AND PELVIS WITH CONTRAST  TECHNIQUE: Multidetector CT imaging of the abdomen and pelvis was performed using the standard protocol following bolus administration of intravenous contrast.  CONTRAST:  134mL OMNIPAQUE IOHEXOL 300 MG/ML  SOLN  COMPARISON:  12/22/2007 CT abdomen/pelvis.  FINDINGS: Lower chest: No significant pulmonary nodules or acute consolidative airspace disease.  Hepatobiliary: Diffuse hepatic steatosis. No liver mass. Distended gallbladder containing layering calcified gallstones measuring up to the 5 mm. Top-normal gallbladder wall thickness. No biliary ductal dilatation.  Pancreas: There is prominent fat stranding and ill-defined fluid surrounding the entire length of the mildly thickened pancreas, in keeping with acute pancreatitis.  No pancreatic parenchymal calcifications. No pancreatic mass. Top-normal caliber pancreatic duct. No focal measurable pancreatic or peripancreatic fluid collections. No areas of pancreatic parenchymal nonenhancement or gas.  Spleen: Normal size. No mass.  Adrenals/Urinary Tract: Normal adrenals. Status post left nephrectomy, with no mass or fluid collection in the left nephrectomy bed. Normal right kidney, with no right hydronephrosis and no right renal mass. Normal bladder.  Stomach/Bowel: Grossly normal stomach. Normal caliber small bowel with no small bowel wall thickening. Appendix is not discretely visualized. Normal large bowel with no diverticulosis, large bowel wall thickening or pericolonic fat stranding.  Vascular/Lymphatic: Atherosclerotic nonaneurysmal abdominal aorta. Patent portal, splenic, hepatic and right renal veins. No pathologically enlarged lymph nodes in the abdomen or pelvis.  Reproductive: Mild prostatomegaly with nonspecific internal prostatic calcification.  Other: No pneumoperitoneum, ascites or focal fluid collection.  Musculoskeletal: No aggressive appearing focal osseous lesions.  IMPRESSION: 1. Acute uncomplicated pancreatitis. 2. Cholelithiasis. Distended gallbladder. No specific findings to suggest acute cholecystitis. 3. No biliary ductal dilatation. 4. Diffuse hepatic steatosis. 5. Status post left nephrectomy, with no abnormal findings in the left nephrectomy bed.   Electronically Signed   By: Ilona Sorrel M.D.   On: 02/28/2015 19:37   Mr 3d Recon At Scanner  03/05/2015   CLINICAL DATA:  Subsequent encounter for abdominal pain and swelling with elevated LFTs. Patient with history of pancreatitis  EXAM: MRI ABDOMEN WITHOUT AND WITH CONTRAST (INCLUDING MRCP)  TECHNIQUE: Multiplanar multisequence MR imaging of the abdomen was performed both before and after the administration of intravenous contrast. Heavily T2-weighted images of the biliary and pancreatic ducts were obtained, and  three-dimensional MRCP images were rendered by post processing.  CONTRAST:  37mL MULTIHANCE GADOBENATE DIMEGLUMINE 529 MG/ML IV SOLN  COMPARISON:  CT scan from 02/28/2015  FINDINGS: Lower chest:  Unremarkable.  Hepatobiliary: No focal abnormality is seen in the liver parenchyma. No focal enhancing lesion. Gallbladder is distended with layering sludge and at least 1 at gallstone measuring in the 5 mm size range. There appears to be a trace amount of pericholecystic fluid. No substantial gallbladder wall thickening. No intrahepatic biliary duct dilatation. No dilatation of the extrahepatic bile ducts. No evidence for choledocholithiasis.  Pancreas: Diffuse fullness of pancreatic tissue again noted, similar to recent CT scan. There is enhancement of the parenchyma throughout with no features to suggest pancreatic necrosis. No dilatation of the main pancreatic duct. The 5 mm cystic focus is identified in the pancreas near the junction of the body and tail (see image 27 series 3 and image 50 series 1102). A small amount of peripancreatic edema persists. No evidence for pseudocyst. No evidence for retroperitoneal abscess.  Spleen: No splenomegaly. No focal mass lesion.  Adrenals/Urinary Tract: The adrenal glands are normal bilaterally. Left kidney is surgically absent. Right kidney has normal MR imaging features.  Stomach/Bowel: Stomach  is nondistended. No gastric wall thickening. No evidence of outlet obstruction. Duodenum is normally positioned as is the ligament of Treitz.  Vascular/Lymphatic: No abdominal aortic aneurysm. The portal vein, superior mesenteric vein, and splenic vein are patent. There is no gastrohepatic or hepatoduodenal ligament lymphadenopathy. No intraperitoneal or retroperitoneal lymphadenopy.  Other: Trace edema is seen in the retroperitoneal tissues around the pancreas. No substantial intraperitoneal free fluid.  Musculoskeletal: No abnormal marrow signal within the visualized bony anatomy.   IMPRESSION: 1. Cholelithiasis without evidence for choledocholithiasis. There is a trace amount of fluid adjacent to the gallbladder, but no overt gallbladder wall thickening to suggest acute cholecystitis. 2. Mild peripancreatic edema without pancreatic necrosis, pseudocyst, or retroperitoneal abscess. A 5 mm cystic focus towards the tail the pancreas may be related to the inflammatory change. No dilatation of the main pancreatic duct. 3. Status post left nephrectomy.   Electronically Signed   By: Misty Stanley M.D.   On: 03/05/2015 08:29   Dg Abd Acute W/chest  02/28/2015   CLINICAL DATA:  Ethanol abuse. Abdominal pain. Generalized weakness.  EXAM: DG ABDOMEN ACUTE W/ 1V CHEST  COMPARISON:  Radiograph 04/30/2008  FINDINGS: Normal cardiac silhouette with ectatic aorta. There is postsurgical change in the LEFT upper lobe along the mediastinum. No effusion, infiltrate, or pneumothorax.  LEFT lower decubitus view demonstrates no intraperitoneal free air. AP view demonstrates no dilated loops of large or small bowel. There is gas the rectum. No pathologic calcifications. No organomegaly.  IMPRESSION: 1.  No acute cardiopulmonary process. 2. No evidence of bowel obstruction or intraperitoneal free air.   Electronically Signed   By: Suzy Bouchard M.D.   On: 02/28/2015 16:45   Mr Abd W/wo Cm/mrcp  03/05/2015   CLINICAL DATA:  Subsequent encounter for abdominal pain and swelling with elevated LFTs. Patient with history of pancreatitis  EXAM: MRI ABDOMEN WITHOUT AND WITH CONTRAST (INCLUDING MRCP)  TECHNIQUE: Multiplanar multisequence MR imaging of the abdomen was performed both before and after the administration of intravenous contrast. Heavily T2-weighted images of the biliary and pancreatic ducts were obtained, and three-dimensional MRCP images were rendered by post processing.  CONTRAST:  39mL MULTIHANCE GADOBENATE DIMEGLUMINE 529 MG/ML IV SOLN  COMPARISON:  CT scan from 02/28/2015  FINDINGS: Lower chest:   Unremarkable.  Hepatobiliary: No focal abnormality is seen in the liver parenchyma. No focal enhancing lesion. Gallbladder is distended with layering sludge and at least 1 at gallstone measuring in the 5 mm size range. There appears to be a trace amount of pericholecystic fluid. No substantial gallbladder wall thickening. No intrahepatic biliary duct dilatation. No dilatation of the extrahepatic bile ducts. No evidence for choledocholithiasis.  Pancreas: Diffuse fullness of pancreatic tissue again noted, similar to recent CT scan. There is enhancement of the parenchyma throughout with no features to suggest pancreatic necrosis. No dilatation of the main pancreatic duct. The 5 mm cystic focus is identified in the pancreas near the junction of the body and tail (see image 27 series 3 and image 50 series 1102). A small amount of peripancreatic edema persists. No evidence for pseudocyst. No evidence for retroperitoneal abscess.  Spleen: No splenomegaly. No focal mass lesion.  Adrenals/Urinary Tract: The adrenal glands are normal bilaterally. Left kidney is surgically absent. Right kidney has normal MR imaging features.  Stomach/Bowel: Stomach is nondistended. No gastric wall thickening. No evidence of outlet obstruction. Duodenum is normally positioned as is the ligament of Treitz.  Vascular/Lymphatic: No abdominal aortic aneurysm. The portal vein, superior mesenteric vein,  and splenic vein are patent. There is no gastrohepatic or hepatoduodenal ligament lymphadenopathy. No intraperitoneal or retroperitoneal lymphadenopy.  Other: Trace edema is seen in the retroperitoneal tissues around the pancreas. No substantial intraperitoneal free fluid.  Musculoskeletal: No abnormal marrow signal within the visualized bony anatomy.  IMPRESSION: 1. Cholelithiasis without evidence for choledocholithiasis. There is a trace amount of fluid adjacent to the gallbladder, but no overt gallbladder wall thickening to suggest acute  cholecystitis. 2. Mild peripancreatic edema without pancreatic necrosis, pseudocyst, or retroperitoneal abscess. A 5 mm cystic focus towards the tail the pancreas may be related to the inflammatory change. No dilatation of the main pancreatic duct. 3. Status post left nephrectomy.   Electronically Signed   By: Misty Stanley M.D.   On: 03/05/2015 08:29    Microbiology: Recent Results (from the past 240 hour(s))  MRSA PCR Screening     Status: None   Collection Time: 02/28/15 11:31 PM  Result Value Ref Range Status   MRSA by PCR NEGATIVE NEGATIVE Final    Comment:        The GeneXpert MRSA Assay (FDA approved for NASAL specimens only), is one component of a comprehensive MRSA colonization surveillance program. It is not intended to diagnose MRSA infection nor to guide or monitor treatment for MRSA infections.   C difficile quick scan w PCR reflex     Status: Abnormal   Collection Time: 03/01/15  3:30 PM  Result Value Ref Range Status   C Diff antigen POSITIVE (A) NEGATIVE Final   C Diff toxin NEGATIVE NEGATIVE Final   C Diff interpretation   Final    C. difficile present, but toxin not detected. This indicates colonization. In most cases, this does not require treatment. If patient has signs and symptoms consistent with colitis, consider treatment. Requires ENTERIC precautions.     Labs: Basic Metabolic Panel:  Recent Labs Lab 03/04/15 0540 03/05/15 0610 03/06/15 0545 03/07/15 0555  NA 137 136 136 139  K 4.0 3.7 3.3* 3.5  CL 107 101 101 104  CO2 23 27 29 30   GLUCOSE 105* 102* 104* 107*  BUN 5* <5* 6 11  CREATININE 0.83 0.84 0.89 1.06  CALCIUM 8.8* 9.0 8.9 8.7*   Liver Function Tests:  Recent Labs Lab 03/04/15 0540 03/05/15 0610 03/06/15 0545 03/07/15 0555  AST 196* 107* 59* 44*  ALT 146* 118* 90* 73*  ALKPHOS 111 105 92 87  BILITOT 0.8 0.4 0.4 0.4  PROT 5.4* 5.7* 5.7* 5.8*  ALBUMIN 2.6* 2.8* 2.8* 2.9*    Recent Labs Lab 03/04/15 0540  LIPASE 291*   No  results for input(s): AMMONIA in the last 168 hours. CBC:  Recent Labs Lab 03/04/15 0540 03/05/15 0610 03/06/15 0545 03/07/15 0555  WBC 7.6 6.9 7.3 7.1  HGB 9.2* 9.8* 9.6* 9.2*  HCT 26.5* 27.7* 26.9* 26.7*  MCV 95.7 95.5 95.1 95.7  PLT 79* 124* 193 249   Cardiac Enzymes: No results for input(s): CKTOTAL, CKMB, CKMBINDEX, TROPONINI in the last 168 hours. BNP: BNP (last 3 results) No results for input(s): BNP in the last 8760 hours.  ProBNP (last 3 results) No results for input(s): PROBNP in the last 8760 hours.  CBG:  Recent Labs Lab 03/05/15 0742 03/06/15 0738 03/08/15 0723 03/09/15 0742 03/10/15 0725  GLUCAP 98 103* 88 130* 99       Signed:  Luis Sami  [facility] 03/10/2015, 4:03 PM

## 2015-03-10 NOTE — Tx Team (Signed)
Initial Interdisciplinary Treatment Plan   PATIENT STRESSORS: Health problems Loss of Grandmother, sister   PATIENT STRENGTHS: Active sense of humor General fund of knowledge Motivation for treatment/growth   PROBLEM LIST: Problem List/Patient Goals Date to be addressed Date deferred Reason deferred Estimated date of resolution  "I need a change in my life" 03/10/15     "I need more motivation" 03/10/15     Depression 03/10/15     Substance abuse  03/10/15                                    DISCHARGE CRITERIA:  Medical problems require only outpatient monitoring  PRELIMINARY DISCHARGE PLAN: Attend aftercare/continuing care group Outpatient therapy  PATIENT/FAMIILY INVOLVEMENT: This treatment plan has been presented to and reviewed with the patient, Aquan Kope.  The patient and family have been given the opportunity to ask questions and make suggestions.  Barbette Or Hodari Chuba 03/10/2015, 7:08 PM

## 2015-03-10 NOTE — Progress Notes (Signed)
Admission note: Pt admitted to [address] hall after being discharged from medical floor at [facility].  Per report, pt was still having suicidal thoughts.  He was admitted to medical floor with possible pancreatitis, c-diff (only positive to antibodies with no active disease).  He has history of kidney cancer.  He recently lost his 61 year old grandmother who was a positive support person.  His sister died recently of breast cancer and his mother was murdered by his step father "Years ago and I still carry that with me."  He denies any suicidal thougths.  No A/V hallucinations.  He states that he has been off of alcohol for two weeks and hasn't smoked crack cocaine in three weeks.  He was oriented to unit, belongings secure in locker 44.  Q 15 minute checks initiated for safety.

## 2015-03-10 NOTE — Progress Notes (Signed)
D: Patient alert and oriented x 4. Patient denies pain/SI/HI/AVH. Patient complained of constipation stating, "I have not have a good bowel movement since I was in the hospital."  This writer gave milk of mag at 2001, pending results.  A: Staff to monitor Q 15 mins for safety. Encouragement and support offered. Scheduled medications administered per orders. R: Patient remains safe on the unit. Patient attended group tonight. Patient visible on hte unit and interacting with peers. Patient taking administered medications.

## 2015-03-10 NOTE — Progress Notes (Signed)
Adult Psychoeducational Group Note  Date:  03/10/2015 Time:  10:24 PM  Group Topic/Focus:  Wrap-Up Group:   The focus of this group is to help patients review their daily goal of treatment and discuss progress on daily workbooks.  Participation Level:  Active  Participation Quality:  Appropriate  Affect:  Appropriate  Cognitive:  Appropriate  Insight: Appropriate  Engagement in Group:  Engaged  Modes of Intervention:  Discussion  Additional Comments:  Pt goal was to discuss his d/c planning although he just came to the hospital. He was admitted today and will see a physician tomorrow.  Sande Rives H 03/10/2015, 10:24 PM

## 2015-03-10 NOTE — Progress Notes (Signed)
Transportation for patient has been arranged. Patient to be transported via [place] around 4:15pm. No further CSW needs reported. CSW to sign off. Please consult if further CSW needs arise.   Lucius Conn, [place] Social Worker [place] [phone]

## 2015-03-10 NOTE — Progress Notes (Signed)
CSW received phone call from [place]. Patient has been accepted to [facility], room 305 bed 1. Accepting MD is [place]. Number to call report is [phone]. Per [facility], patient to be transported after 3:00pm. CSW to assist patient in completing paperwork to fax to [facility].   Lucius Conn, [place] Social Worker [place] [phone]

## 2015-03-10 NOTE — Progress Notes (Addendum)
Addendum:  Per psychiatrist, pt still meets inpatient criteria with suicidal ideation, depression, and substance abuse.  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  CSW continuing to seek inpatient placement.  Belia Heman, LCSW  Clinical Social Work  [facility] [phone]      Pt declined for [facility] observation unit. CSW consulting with psychiatrist for disposition recommendation of inpatient or discharge with outpatient resources.   Belia Heman, [place] Work  [employer] [phone]
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
## 2015-03-10 NOTE — Progress Notes (Signed)
Pt DC to [facility] transported via [place]. Pt alert and oriented times four, VSS, skin intact, belongings sent with pt.

## 2015-03-11 ENCOUNTER — Encounter (HOSPITAL_COMMUNITY): Payer: Self-pay | Admitting: Psychiatry

## 2015-03-11 DIAGNOSIS — F1020 Alcohol dependence, uncomplicated: Secondary | ICD-10-CM

## 2015-03-11 DIAGNOSIS — F1410 Cocaine abuse, uncomplicated: Secondary | ICD-10-CM | POA: Diagnosis present

## 2015-03-11 DIAGNOSIS — F332 Major depressive disorder, recurrent severe without psychotic features: Principal | ICD-10-CM

## 2015-03-11 MED ORDER — PANTOPRAZOLE SODIUM 40 MG PO TBEC
40.0000 mg | DELAYED_RELEASE_TABLET | Freq: Two times a day (BID) | ORAL | Status: DC
  Administered 2015-03-11 – 2015-03-17 (×13): 40 mg via ORAL
  Filled 2015-03-11: qty 60
  Filled 2015-03-11 (×6): qty 1
  Filled 2015-03-11: qty 60
  Filled 2015-03-11 (×4): qty 1
  Filled 2015-03-11 (×3): qty 60
  Filled 2015-03-11 (×3): qty 1
  Filled 2015-03-11: qty 60
  Filled 2015-03-11: qty 1

## 2015-03-11 MED ORDER — FLUOXETINE HCL 20 MG PO CAPS
20.0000 mg | ORAL_CAPSULE | Freq: Every day | ORAL | Status: DC
  Administered 2015-03-11 – 2015-03-17 (×7): 20 mg via ORAL
  Filled 2015-03-11 (×2): qty 1
  Filled 2015-03-11: qty 14
  Filled 2015-03-11: qty 1
  Filled 2015-03-11 (×2): qty 14
  Filled 2015-03-11 (×5): qty 1

## 2015-03-11 MED ORDER — POLYETHYLENE GLYCOL 3350 17 G PO PACK: 17.0000 g | PACK | Freq: Every day | ORAL | Status: DC | PRN

## 2015-03-11 MED ORDER — FOLIC ACID 1 MG PO TABS
1.0000 mg | ORAL_TABLET | Freq: Every day | ORAL | Status: DC
  Administered 2015-03-11 – 2015-03-17 (×7): 1 mg via ORAL
  Filled 2015-03-11 (×10): qty 1

## 2015-03-11 MED ORDER — CLARITHROMYCIN 500 MG PO TABS
500.0000 mg | ORAL_TABLET | Freq: Two times a day (BID) | ORAL | Status: DC
  Administered 2015-03-11 – 2015-03-17 (×13): 500 mg via ORAL
  Filled 2015-03-11: qty 1
  Filled 2015-03-11: qty 8
  Filled 2015-03-11 (×3): qty 1
  Filled 2015-03-11: qty 8
  Filled 2015-03-11 (×2): qty 1
  Filled 2015-03-11: qty 8
  Filled 2015-03-11 (×3): qty 1
  Filled 2015-03-11: qty 8
  Filled 2015-03-11 (×2): qty 1
  Filled 2015-03-11 (×2): qty 8
  Filled 2015-03-11: qty 1

## 2015-03-11 MED ORDER — CLONIDINE HCL 0.1 MG PO TABS
0.1000 mg | ORAL_TABLET | Freq: Two times a day (BID) | ORAL | Status: DC
  Administered 2015-03-11 – 2015-03-17 (×11): 0.1 mg via ORAL
  Filled 2015-03-11 (×2): qty 1
  Filled 2015-03-11 (×2): qty 60
  Filled 2015-03-11 (×2): qty 1
  Filled 2015-03-11: qty 60
  Filled 2015-03-11: qty 1
  Filled 2015-03-11 (×2): qty 60
  Filled 2015-03-11: qty 1
  Filled 2015-03-11: qty 60
  Filled 2015-03-11 (×7): qty 1

## 2015-03-11 MED ORDER — TRAZODONE HCL 50 MG PO TABS
50.0000 mg | ORAL_TABLET | Freq: Every day | ORAL | Status: DC
  Administered 2015-03-12 – 2015-03-15 (×4): 50 mg via ORAL
  Filled 2015-03-11: qty 1
  Filled 2015-03-11: qty 30
  Filled 2015-03-11: qty 1
  Filled 2015-03-11 (×2): qty 30
  Filled 2015-03-11 (×4): qty 1

## 2015-03-11 MED ORDER — AMOXICILLIN 500 MG PO CAPS
1000.0000 mg | ORAL_CAPSULE | Freq: Two times a day (BID) | ORAL | Status: DC
  Administered 2015-03-11 – 2015-03-17 (×13): 1000 mg via ORAL
  Filled 2015-03-11: qty 8
  Filled 2015-03-11: qty 2
  Filled 2015-03-11: qty 8
  Filled 2015-03-11 (×2): qty 2
  Filled 2015-03-11: qty 8
  Filled 2015-03-11 (×2): qty 2
  Filled 2015-03-11: qty 8
  Filled 2015-03-11 (×2): qty 4
  Filled 2015-03-11 (×2): qty 2
  Filled 2015-03-11: qty 4
  Filled 2015-03-11: qty 2
  Filled 2015-03-11: qty 8
  Filled 2015-03-11 (×2): qty 2
  Filled 2015-03-11: qty 4
  Filled 2015-03-11: qty 2
  Filled 2015-03-11: qty 8

## 2015-03-11 NOTE — BHH Counselor (Signed)
Adult Comprehensive Assessment  Patient ID: Jeremy [name], male   DOB: [birth year], 59 y.o.   MRN: [id number]  Information Source: Information source: Patient  Current Stressors:  Educational / Learning stressors: GED Employment / Job issues: unemployed;  Family Relationships: no relationship with adult daughters; burned alot of bridges with remaining family members Financial / Lack of resources (include bankruptcy): no insurance; no income Housing / Lack of housing: homeless for past few months Physical health (include injuries & life threatening diseases): chronic kindey disease, pancreatitis, bleeding ulcers, high bp-managed with medication. Social relationships: poor-support network in [place] Substance abuse: beer; liquor; wine--"all day every day for the past 40 days." no other drug abuse reported Bereavement / Loss: grandmother and sister died recently--pt identifes their deaths as trigger for relapse.  Living/Environment/Situation:  Living Arrangements: Alone Living conditions (as described by patient or guardian): homeless for past few months How long has patient lived in current situation?: 1 1/2 months What is atmosphere in current home: Dangerous, Temporary, Chaotic  Family History:  Marital status: Divorced Divorced, when?: several years ago What types of issues is patient dealing with in the relationship?: married/divorced 2 x-- Additional relationship information: alcohol problems got in the way of both marriages. Does patient have children?: Yes How many children?: 2 How is patient's relationship with their children?: 2 adult daughters "I can't remember the last time I talkd to them." poor relationship.  Childhood History:  By whom was/is the patient raised?: Mother, Grandparents Additional childhood history information: mother and paternal grandmother raised pt. "my father was a bad alcoholic." Description of patient's relationship with caregiver when they were a  child: close to mother/grandmother Patient's description of current relationship with people who raised him/her: mother/grandmother deceased. no relationship with father growing up. Does patient have siblings?: Yes Number of Siblings: 6 Description of patient's current relationship with siblings: pt has 31 half siblings. close to brother who lives in [place]. Did patient suffer any verbal/emotional/physical/sexual abuse as a child?: No Did patient suffer from severe childhood neglect?: No Has patient ever been sexually abused/assaulted/raped as an adolescent or adult?: No Was the patient ever a victim of a crime or a disaster?: No Witnessed domestic violence?: No Has patient been effected by domestic violence as an adult?: No  Education:  Highest grade of school patient has completed: dropped out in 9th grade. 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 Has patient ever been in the [employer]?: No Has patient ever served in combat?: No  Financial Resources:   Museum/gallery curator resources: [employer], No income Does patient have a Programmer, applications or guardian?: No  Alcohol/Substance Abuse:   What has been your use of drugs/alcohol within the last 12 months?: alcohol binge for the past 40 days ("beer, liquor, wine all day every day as much as I could get." If attempted suicide, did drugs/alcohol play a role in this?: No (SI thoughts but no attempt per pt) Alcohol/Substance Abuse Treatment Hx: Past Tx, Inpatient, Past detox, Attends AA/NA If yes, describe treatment: [facility] in 2012-67mo program; [facility]  2-3x. hx in AA groups Has alcohol/substance abuse ever caused legal problems?: Yes (  pt reports past DUI-no license currently)  Social Support System:   Patient's Community Support System: Fair Astronomer System: deacon/brother in [place] are major positive supports Type of faith/religion: Darrick Meigs How does patient's faith help to cope with current illness?: prayer; church community  Leisure/Recreation:   Leisure and Hobbies: work out in [employer]; ride Manufacturing engineer:   What things does the patient do well?: motivated to go to [facility] program; able to identify positive supports In what areas does patient struggle / problems for patient: chronic alcohol abuse/recent binge and relapse.   Discharge Plan:   Does patient have access to transportation?: No Plan for no access to transportation at discharge: working with deacon of his church to secure transportation from [facility] Will patient be returning to same living situation after discharge?: No Plan for living situation after discharge: [facility] program in [place]. Currently receiving community mental health services: No If no, would patient like referral for services when discharged?: Yes (What county?) [facility]) Does patient have financial barriers related to discharge medications?: Yes Patient description of barriers related to discharge medications: no income/no insurance  Summary/Recommendations:    Pt is 59 year old male admitted to [facility] due to [place], depression, and alcohol abuse. Pt was originally admitted to [facility] hospital due to acute pancreatitis and medical issues. Pt reports that he is from [place], [place] and came to [place] 1 1/2 months ago "to relapse." Pt reports that his grandmother and sister recently passed away, triggering his relapse after 2 yrs of sobriety. Pt binged for 40 days on alcohol. He is hoping to be accepted to [facility] at [facility]. He has no current  outpatient mental health providers. Recommendations for pt include: crisis stabilization, therapeutic milieu, encourage group attendance and participation, medication management for mood stabilization, and development of comprehensive mental wellness/sobriety plan.   Smart, Maddax Palinkas LCSWA 03/11/2015 1:44 PM
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
## 2015-03-11 NOTE — Clinical Social Work Note (Signed)
Per pt request, CSW left message for director at [facility] program [facility]) to provide phone number/point of contact if they need any paperwork from [facility]. Pt also left message requesting call back for phone interview and to ask about waitlist/admission process.  Maxie Better, LCSWA Clinical Social Worker 03/11/2015 4:19 PM

## 2015-03-11 NOTE — BHH Suicide Risk Assessment (Signed)
[place] INPATIENT:  Family/Significant Other Suicide Prevention Education  Suicide Prevention Education:  Education Completed; Juanetta Beets (pt's deacon/friend) [phone] has been identified by the patient as the family member/significant other with whom the patient will be residing, and identified as the person(s) who will aid the patient in the event of a mental health crisis (suicidal ideations/suicide attempt).  With written consent from the patient, the family member/significant other has been provided the following suicide prevention education, prior to the and/or following the discharge of the patient.  The suicide prevention education provided includes the following:  Suicide risk factors  Suicide prevention and interventions  National Suicide Hotline telephone number  [facility] assessment telephone number  [facility] Emergency Assistance [place] and/or Residential Mobile Crisis Unit telephone number  Request made of family/significant other to:  Remove weapons (e.g., guns, rifles, knives), all items previously/currently identified as safety concern.    Remove drugs/medications (over-the-counter, prescriptions, illicit drugs), all items previously/currently identified as a safety concern.  The family member/significant other verbalizes understanding of the suicide prevention education information provided.  The family member/significant other agrees to remove the items of safety concern listed above.  Smart, Kaylanni Ezelle LCSWA  03/11/2015, 4:17 PM
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
## 2015-03-11 NOTE — Plan of Care (Signed)
Problem: Ineffective individual coping Goal: STG: Patient will remain free from self harm Outcome: Completed/Met Date Met:  03/11/15 Pt denies any suicidal thoughts.  "I was suicidal when I came in the the emergency room drunk, I love myself too much to hurt myself."

## 2015-03-11 NOTE — BHH Suicide Risk Assessment (Signed)
[facility] Admission Suicide Risk Assessment   Nursing information obtained from:  Patient Demographic factors:  Low socioeconomic status, Living alone Current Mental Status:  NA Loss Factors:  Loss of significant relationship (grandmother, mother to murder, sister) Historical Factors:  NA Risk Reduction Factors:  Religious beliefs about death, Positive social support Total Time spent with patient: 45 minutes Principal Problem: MDD (major depressive disorder), recurrent severe, without psychosis ([place]) Diagnosis:   Patient Active Problem List   Diagnosis Date Noted  . Alcohol use disorder, severe, dependence ([place]) [F10.20] 03/11/2015  . Cocaine abuse [F14.10] 03/11/2015  . MDD (major depressive disorder), recurrent severe, without psychosis ([place]) [F33.2] 03/10/2015  . Hematemesis [K92.0]   . Protein-calorie malnutrition, severe ([place]) [E43] 03/03/2015  . Shock liver [K75.9] 03/02/2015  . Melena [K92.1] 03/02/2015  . Abnormal liver function tests [R79.89] 03/01/2015  . Pancreatitis [K85.9] 02/28/2015  . Acute alcoholic hepatitis [L89.21] 02/28/2015  . Acute kidney injury ([place]) [N17.9] 02/28/2015  . Alcohol abuse [F10.10] 02/28/2015  . Metabolic acidosis [J94.1] 02/28/2015  . Leukocytosis [D72.829] 02/28/2015  . Ketoacidosis [E87.2] 02/28/2015  . Lactic acidosis [E87.2] 02/28/2015     Continued Clinical Symptoms:  Alcohol Use Disorder Identification Test Final Score (AUDIT): 0 The "Alcohol Use Disorders Identification Test", Guidelines for Use in Primary Care, Second Edition.  World Pharmacologist [facility]). Score between 0-7:  no or low risk or alcohol related problems. Score between 8-15:  moderate risk of alcohol related problems. Score between 16-19:  high risk of alcohol related problems. Score 20 or above:  warrants further diagnostic evaluation for alcohol dependence and treatment.   CLINICAL FACTORS:   Depression:   Comorbid alcohol abuse/dependence Alcohol/Substance  Abuse/Dependencies  Psychiatric Specialty Exam: Physical Exam  ROS  Blood pressure 104/70, pulse 98, temperature 99 F (37.2 C), temperature source Oral, resp. rate 18, height 5\' 9"  (1.753 m), weight 65.772 kg (145 lb), SpO2 100 %.Body mass index is 21.4 kg/(m^2).    COGNITIVE FEATURES THAT CONTRIBUTE TO RISK:  Closed-mindedness, Polarized thinking and Thought constriction (tunnel vision)    SUICIDE RISK:   Moderate:  Frequent suicidal ideation with limited intensity, and duration, some specificity in terms of plans, no associated intent, good self-control, limited dysphoria/symptomatology, some risk factors present, and identifiable protective factors, including available and accessible social support.  PLAN OF CARE: See Admission H and P  Medical Decision Making:  Review of Psycho-Social Stressors (1), Review or order clinical lab tests (1), Review of Medication Regimen & Side Effects (2) and Review of New Medication or Change in Dosage (2)  I certify that inpatient services furnished can reasonably be expected to improve the patient's condition.   [place] A 03/11/2015, 12:19 PM

## 2015-03-11 NOTE — BHH Group Notes (Signed)
[place] Group Notes:  (Nursing/MHT/Case Management/Adjunct)  Date:  03/11/2015  Time:  1:31 PM  Type of Therapy:  Nurse Education  Participation Level:  Active  Participation Quality:  Appropriate, Sharing and Supportive  Affect:  Appropriate  Cognitive:  Alert and Appropriate  Insight:  Appropriate  Engagement in Group:  Supportive  Modes of Intervention:  Discussion and Education  Summary of Progress/Problems:  Group topic was Recovery.  Discussed setting appropriate/measurable goals.  Jeremy [name] was very talkative during group.  He was able to share his life experience and his goals to get into long term treatment in hopes of remaining clean and sober.  [place] 03/11/2015, 1:31 PM

## 2015-03-11 NOTE — BHH Group Notes (Signed)
[place] LCSW Group Therapy  03/11/2015 1:30 PM  Type of Therapy:  Group Therapy  Participation Level:  Active  Participation Quality:  Attentive  Affect:  Appropriate  Cognitive:  Alert and Oriented  Insight:  Improving  Engagement in Therapy:  Improving  Modes of Intervention:  Discussion, Education, Exploration, Problem-solving, Rapport Building, Socialization and Support  Summary of Progress/Problems: [facility] Speaker came to talk about his personal journey with substance abuse and addiction. The pt processed ways by which to relate to the speaker. [place] speaker provided handouts and educational information pertaining to groups and services offered by the [facility].   Smart, Jackston [place] LCSWA 03/11/2015, 1:30 PM

## 2015-03-11 NOTE — Progress Notes (Signed)
NUTRITION ASSESSMENT  Pt identified as at risk on the Malnutrition Screen Tool  INTERVENTION: 1. Supplements: Continue Ensure Enlive po BID, each supplement provides 350 kcal and 20 grams of protein  NUTRITION DIAGNOSIS: Unintentional weight loss related to sub-optimal intake as evidenced by pt report.   Goal: Pt to meet >/= 90% of their estimated nutrition needs.  Monitor:  PO intake  Assessment:  Pt admitted with depression and ETOH use. PMHx of pancreatitis. Pt currently homeless. Pt was recently admitted at [facility] where he was diagnosed with severe malnutrition and was ordered supplements. Pt has been ordered Ensure supplements for this admission.  Height: Ht Readings from Last 1 Encounters:  03/10/15 5\' 9"  (1.753 m)    Weight: Wt Readings from Last 1 Encounters:  03/10/15 145 lb (65.772 kg)    Weight Hx: Wt Readings from Last 10 Encounters:  03/10/15 145 lb (65.772 kg)  03/01/15 147 lb 7.8 oz (66.9 kg)    BMI:  Body mass index is 21.4 kg/(m^2). Pt meets criteria for normal range based on current BMI.  Estimated Nutritional Needs: Kcal: 25-35 kcal/kg Protein: > 1 gram protein/kg Fluid: 1 ml/kcal  Diet Order: Diet regular Room service appropriate?: Yes; Fluid consistency:: Thin Pt is also offered choice of unit snacks mid-morning and mid-afternoon.  Pt is eating as desired.   Lab results and medications reviewed.   Clayton Bibles, MS, RD, LDN Pager: [phone] After Hours Pager: [phone]

## 2015-03-11 NOTE — Progress Notes (Signed)
DAR Note: Jeremy [name] has been visible on the unit.  He denies any SI/HI or A/V hallucinations.  He states that he has never wanted to harm himself before and attributes his recent thoughts to being intoxicated upon arrival to the emergency room.  He has been attending groups.  He denies any physical complaints.  He wanted MOM this morning and urged him to wait until later since he took some last night.  He reported during the afternoon that he had good results from the MOM.  He completed his self inventory and reports that his depression, hopelessness and anxiety are all 0/10.  His goal for today is to work on trying to get into the [facility]' in [place].  Q 15 minute checks maintained for safety.  Encouraged participation in group and unit activities.

## 2015-03-11 NOTE — Progress Notes (Signed)
Recreation Therapy Notes  Animal-Assisted Activity (AAA) Program Checklist/Progress Notes Patient Eligibility Criteria Checklist & Daily Group note for Rec Tx Intervention  Date: 10.11.2016 Time: 2:45pm Location: [address]    AAA/T Program Assumption of Risk Form signed by Patient/ or Parent Legal Guardian NO, patient refused to sign consent for at admission.   Behavioral Response:Did not attend.   Laureen Ochs Quy Lotts, LRT/CTRS       Zinia Innocent L 03/11/2015 3:05 PM

## 2015-03-12 NOTE — BHH Group Notes (Signed)
[place] LCSW Group Therapy  03/12/2015 1:00 PM  Type of Therapy:  Group Therapy  Participation Level:  Active  Participation Quality:  Attentive  Affect:  Appropriate  Cognitive:  Alert and Oriented  Insight:  Improving  Engagement in Therapy:  Improving  Modes of Intervention:  Confrontation, Discussion, Education, Exploration, Problem-solving, Rapport Building, Socialization and Support  Summary of Progress/Problems: Feelings around Relapse. Jeremy [name] was attentive and engaged during today's processing group. He shared that he plans to go for further treatment. "I got to get myself away from alcohol and drugs. I'm too old for this." Jeremy [name] shared that he is upset with himself for his 40 day binge but stated that he is now ready to get clean and sober for good.   Smart, Eira Alpert LCSWA 03/12/2015, 1:00 PM

## 2015-03-12 NOTE — Plan of Care (Signed)
Problem: Diagnosis: Increased Risk For Suicide Attempt Goal: STG-Patient Will Comply With Medication Regime Outcome: Progressing Pt compliant with prescribed medication regimen.      

## 2015-03-12 NOTE — BHH Group Notes (Signed)
[facility] LCSW Aftercare Discharge Planning Group Note   03/12/2015 11:18 AM  Participation Quality:  Appropriate   Mood/Affect:  Appropriate  Depression Rating:  1  Anxiety Rating:  1  Thoughts of Suicide:  No Will you contract for safety?   NA  Current AVH:  No  Plan for Discharge/Comments:  Pt reports that he slept great and his hoping that [employer] [facility] program will call him today.   Transportation Means: bus  Supports: Acupuncturist, Research officer, trade union

## 2015-03-12 NOTE — Progress Notes (Signed)
[facility] MD Progress Note  03/12/2015 6:17 PM Jeremy [name]  MRN:  [id number] Subjective:  Esiquio states he is still having a hard time. He states he is worried as he does not want to relapse and have flare up of the ulcer and the pancreatitis. States he needs more help. He would like to go to [facility] in [place]. In that way he can work towards getting closer to his family there while working on his addiction.  Principal Problem: MDD (major depressive disorder), recurrent severe, without psychosis ([place]) Diagnosis:   Patient Active Problem List   Diagnosis Date Noted  . Alcohol use disorder, severe, dependence ([place]) [F10.20] 03/11/2015  . Cocaine abuse [F14.10] 03/11/2015  . MDD (major depressive disorder), recurrent severe, without psychosis ([place]) [F33.2] 03/10/2015  . Hematemesis [K92.0]   . Protein-calorie malnutrition, severe ([place]) [E43] 03/03/2015  . Shock liver [K75.9] 03/02/2015  . Melena [K92.1] 03/02/2015  . Abnormal liver function tests [R79.89] 03/01/2015  . Pancreatitis [K85.9] 02/28/2015  . Acute alcoholic hepatitis [K44.01] 02/28/2015  . Acute kidney injury ([place]) [N17.9] 02/28/2015  . Alcohol abuse [F10.10] 02/28/2015  . Metabolic acidosis [U27.2] 02/28/2015  . Leukocytosis [D72.829] 02/28/2015  . Ketoacidosis [E87.2] 02/28/2015  . Lactic acidosis [E87.2] 02/28/2015   Total Time spent with patient: 30 minutes  Past Psychiatric History: see Admission H and P  Past Medical History:  Past Medical History  Diagnosis Date  . FH: kidney cancer   . Cancer [facility]) 2006    kidney cancer  . Chronic kidney disease     RCC, s/p resection in 2009    Past Surgical History  Procedure Laterality Date  . Nephrectomy    . Left lobe of lung removed  2006  . Ulcer surgery  2008  . Esophagogastroduodenoscopy (egd) with propofol N/A 03/04/2015    Procedure: ESOPHAGOGASTRODUODENOSCOPY (EGD) WITH PROPOFOL;  Surgeon: Ladene Artist, MD;  Location: [facility];  Service: Endoscopy;   Laterality: N/A;   Family History:  Family History  Problem Relation Age of Onset  . Diabetes Father   . Alcohol abuse Other     "many people'   Family Psychiatric  History: see Admission H and P Social History:  History  Alcohol Use  . 20.4 oz/week  . 24 Cans of beer, 10 Standard drinks or equivalent per week     History  Drug Use  . Yes  . Special: Cocaine    Social History   Social History  . Marital Status: Divorced    Spouse Name: N/A  . Number of Children: N/A  . Years of Education: N/A   Social History Main Topics  . Smoking status: Former Smoker    Types: Cigarettes    Quit date: 06/19/2014  . Smokeless tobacco: Never Used  . Alcohol Use: 20.4 oz/week    24 Cans of beer, 10 Standard drinks or equivalent per week  . Drug Use: Yes    Special: Cocaine  . Sexual Activity: Not Asked   Other Topics Concern  . None   Social History Narrative   Additional Social History:                         Sleep: Fair  Appetite:  Fair  Current Medications: Current Facility-Administered Medications  Medication Dose Route Frequency Provider Last Rate Last Dose  . acetaminophen (TYLENOL) tablet 650 mg  650 mg Oral Q6H PRN Ambrose Finland, MD      . alum & mag hydroxide-simeth (  MAALOX/MYLANTA) 200-200-20 MG/5ML suspension 30 mL  30 mL Oral Q4H PRN Ambrose Finland, MD      . amoxicillin (AMOXIL) capsule 1,000 mg  1,000 mg Oral Q12H Nicholaus Bloom, MD   1,000 mg at 03/12/15 5427  . clarithromycin (BIAXIN) tablet 500 mg  500 mg Oral Q12H Nicholaus Bloom, MD   500 mg at 03/12/15 0623  . cloNIDine (CATAPRES) tablet 0.1 mg  0.1 mg Oral BID Nicholaus Bloom, MD   0.1 mg at 03/12/15 1701  . feeding supplement (ENSURE ENLIVE) (ENSURE ENLIVE) liquid 237 mL  237 mL Oral BID BM Nicholaus Bloom, MD   237 mL at 03/12/15 1438  . FLUoxetine (PROZAC) capsule 20 mg  20 mg Oral Daily Nicholaus Bloom, MD   20 mg at 03/12/15 7628  . folic acid (FOLVITE) tablet 1 mg  1 mg Oral  Daily Nicholaus Bloom, MD   1 mg at 03/12/15 0808  . magnesium hydroxide (MILK OF MAGNESIA) suspension 30 mL  30 mL Oral Daily PRN Ambrose Finland, MD   30 mL at 03/10/15 2001  . pantoprazole (PROTONIX) EC tablet 40 mg  40 mg Oral BID Nicholaus Bloom, MD   40 mg at 03/12/15 1701  . polyethylene glycol (MIRALAX / GLYCOLAX) packet 17 g  17 g Oral Daily PRN Nicholaus Bloom, MD      . traZODone (DESYREL) tablet 50 mg  50 mg Oral QHS Nicholaus Bloom, MD   50 mg at 03/11/15 2209    Lab Results: No results found for this or any previous visit (from the past 60 hour(s)).  Physical Findings: AIMS: Facial and Oral Movements Muscles of Facial Expression: None, normal Lips and Perioral Area: None, normal Jaw: None, normal Tongue: None, normal,Extremity Movements Upper (arms, wrists, hands, fingers): None, normal Lower (legs, knees, ankles, toes): None, normal, Trunk Movements Neck, shoulders, hips: None, normal, Overall Severity Severity of abnormal movements (highest score from questions above): None, normal Incapacitation due to abnormal movements: None, normal Patient's awareness of abnormal movements (rate only patient's report): No Awareness, Dental Status Current problems with teeth and/or dentures?: No Does patient usually wear dentures?: No  CIWA:  CIWA-Ar Total: 0 COWS:     Musculoskeletal: Strength & Muscle Tone: within normal limits Gait & Station: normal Patient leans: normal  Psychiatric Specialty Exam: Review of Systems  Constitutional: Negative.   HENT: Negative.   Eyes: Negative.   Respiratory: Negative.   Cardiovascular: Negative.   Gastrointestinal: Negative.   Genitourinary: Negative.   Musculoskeletal: Negative.   Skin: Negative.   Neurological: Negative.   Endo/Heme/Allergies: Negative.   Psychiatric/Behavioral: Positive for depression and substance abuse. The patient is nervous/anxious.     Blood pressure 114/65, pulse 89, temperature 98.3 F (36.8 C),  temperature source Oral, resp. rate 16, height 5\' 9"  (1.753 m), weight 65.772 kg (145 lb), SpO2 100 %.Body mass index is 21.4 kg/(m^2).  General Appearance: Fairly Groomed  Engineer, water::  Fair  Speech:  Clear and Coherent  Volume:  Decreased  Mood:  Anxious and Depressed  Affect:  Depressed  Thought Process:  Coherent and Goal Directed  Orientation:  Full (Time, Place, and Person)  Thought Content:  symptoms events worries concerns  Suicidal Thoughts:  No  Homicidal Thoughts:  No  Memory:  Immediate;   Fair Recent;   Fair Remote;   Fair  Judgement:  Fair  Insight:  Present  Psychomotor Activity:  Restlessness  Concentration:  Fair  Recall:  [place]: Fair  Akathisia:  No  Handed:  Right  AIMS (if indicated):     Assets:  Desire for Improvement  ADL's:  Intact  Cognition: WNL  Sleep:  Number of Hours: 5.5   Treatment Plan Summary: Daily contact with patient to assess and evaluate symptoms and progress in treatment and Medication management Supportive approach/coping skills Alcohol dependence; continue to work a relapse prevention plan Depression; will continue the Prozac 20 mg reassess for the need to increase the dose Insomnia; will continue the Trazodone 50 mg HS Will work with CBT/mindfulness  Will facilitate getting admitted to [facility] Myrtle Haller A 03/12/2015, 6:17 PM

## 2015-03-12 NOTE — Progress Notes (Signed)
D: Per patient self inventory form pt reports he slept good last night. He reports a good appetite, normal energy level, good concentration. He rates depression 1/10, hopelessness 1/10, anxiety 1/10- all on 0-10 scale, 10 being the worse. He denies SI/HI. Denies AVH. Denies physical pain. Pt reports his goal is to "contact treatment center." and he will "call them" to help meet his goal. Pt cooperative, but intrusive at times on approach.   A:Pt encouraged to be active in the milieu. Special checks q 15 mins in place for safety. Medication administered per MD order(see eMAR). Encouragement and support provided.  R:Safety maintained. Compliant with medication regimen. Will continue to monitor.

## 2015-03-12 NOTE — Progress Notes (Signed)
Recreation Therapy Notes  Date: 10.12.2016 Time: 9:30am Location: [address] Group Room   Group Topic: Stress Management  Goal Area(s) Addresses:  Patient will actively participate in stress management techniques presented during session.   Behavioral Response: Did not attend.  Jeremy [name], LRT/CTRS        Jeremy [name] L 03/12/2015 1:02 PM 

## 2015-03-12 NOTE — Clinical Social Work Note (Signed)
Per pt request, CSW faxed referral to [facility].  Maxie Better, [employer] Clinical Social Worker 03/12/2015 2:32 PM

## 2015-03-12 NOTE — Progress Notes (Signed)
D: Patient alert and oriented x 4. Patient denies pain/SI/HI/AVH. Patient refused Tramadol stating, "I can go to sleep on my own without medication."  A: Staff to monitor Q 15 mins for safety. Encouragement and support offered. Scheduled medications administered per orders. R: Patient remains safe on the unit. Patient attended group tonight. Patient visible on hte unit and interacting with peers. Patient taking administered medications.

## 2015-03-13 NOTE — Progress Notes (Signed)
[facility] MD Progress Note  03/13/2015 4:21 PM Jeremy [name]  MRN:  [id number] Subjective:  Continues to worry about where to go from here. He sates he will not be able to make it if he was to go back to the streets. He is hopeful that he can go to [facility]. They called this afternoon requesting information about his medications. If not there is a chance he can go to [facility] on Monday. He admits he cant stop worrying. The uncertainty affects his mood making him expect the worst outcome what triggers more depression and cravings  Principal Problem: MDD (major depressive disorder), recurrent severe, without psychosis ([place]) Diagnosis:   Patient Active Problem List   Diagnosis Date Noted  . Alcohol use disorder, severe, dependence ([place]) [F10.20] 03/11/2015  . Cocaine abuse [F14.10] 03/11/2015  . MDD (major depressive disorder), recurrent severe, without psychosis ([place]) [F33.2] 03/10/2015  . Hematemesis [K92.0]   . Protein-calorie malnutrition, severe ([place]) [E43] 03/03/2015  . Shock liver [K75.9] 03/02/2015  . Melena [K92.1] 03/02/2015  . Abnormal liver function tests [R79.89] 03/01/2015  . Pancreatitis [K85.9] 02/28/2015  . Acute alcoholic hepatitis [D22.02] 02/28/2015  . Acute kidney injury ([place]) [N17.9] 02/28/2015  . Alcohol abuse [F10.10] 02/28/2015  . Metabolic acidosis [R42.7] 02/28/2015  . Leukocytosis [D72.829] 02/28/2015  . Ketoacidosis [E87.2] 02/28/2015  . Lactic acidosis [E87.2] 02/28/2015   Total Time spent with patient: 30 minutes  Past Psychiatric History: see admission H and P  Past Medical History:  Past Medical History  Diagnosis Date  . FH: kidney cancer   . Cancer [facility]) 2006    kidney cancer  . Chronic kidney disease     RCC, s/p resection in 2009    Past Surgical History  Procedure Laterality Date  . Nephrectomy    . Left lobe of lung removed  2006  . Ulcer surgery  2008  . Esophagogastroduodenoscopy (egd) with propofol N/A 03/04/2015    Procedure:  ESOPHAGOGASTRODUODENOSCOPY (EGD) WITH PROPOFOL;  Surgeon: Ladene Artist, MD;  Location: [facility];  Service: Endoscopy;  Laterality: N/A;   Family History:  Family History  Problem Relation Age of Onset  . Diabetes Father   . Alcohol abuse Other     "many people'   Family Psychiatric  History: See admission H and P Social History:  History  Alcohol Use  . 20.4 oz/week  . 24 Cans of beer, 10 Standard drinks or equivalent per week     History  Drug Use  . Yes  . Special: Cocaine    Social History   Social History  . Marital Status: Divorced    Spouse Name: N/A  . Number of Children: N/A  . Years of Education: N/A   Social History Main Topics  . Smoking status: Former Smoker    Types: Cigarettes    Quit date: 06/19/2014  . Smokeless tobacco: Never Used  . Alcohol Use: 20.4 oz/week    24 Cans of beer, 10 Standard drinks or equivalent per week  . Drug Use: Yes    Special: Cocaine  . Sexual Activity: Not Asked   Other Topics Concern  . None   Social History Narrative   Additional Social History:                         Sleep: Fair  Appetite:  Fair  Current Medications: Current Facility-Administered Medications  Medication Dose Route Frequency Provider Last Rate Last Dose  . acetaminophen (TYLENOL) tablet 650 mg  650 mg Oral Q6H PRN Ambrose Finland, MD      . alum & mag hydroxide-simeth (MAALOX/MYLANTA) 200-200-20 MG/5ML suspension 30 mL  30 mL Oral Q4H PRN Ambrose Finland, MD      . amoxicillin (AMOXIL) capsule 1,000 mg  1,000 mg Oral Q12H Nicholaus Bloom, MD   1,000 mg at 03/13/15 0815  . clarithromycin (BIAXIN) tablet 500 mg  500 mg Oral Q12H Nicholaus Bloom, MD   500 mg at 03/13/15 0815  . cloNIDine (CATAPRES) tablet 0.1 mg  0.1 mg Oral BID Nicholaus Bloom, MD   0.1 mg at 03/12/15 1701  . feeding supplement (ENSURE ENLIVE) (ENSURE ENLIVE) liquid 237 mL  237 mL Oral BID BM Nicholaus Bloom, MD   237 mL at 03/13/15 1056  . FLUoxetine  (PROZAC) capsule 20 mg  20 mg Oral Daily Nicholaus Bloom, MD   20 mg at 03/13/15 0815  . folic acid (FOLVITE) tablet 1 mg  1 mg Oral Daily Nicholaus Bloom, MD   1 mg at 03/13/15 0815  . magnesium hydroxide (MILK OF MAGNESIA) suspension 30 mL  30 mL Oral Daily PRN Ambrose Finland, MD   30 mL at 03/10/15 2001  . pantoprazole (PROTONIX) EC tablet 40 mg  40 mg Oral BID Nicholaus Bloom, MD   40 mg at 03/13/15 0815  . polyethylene glycol (MIRALAX / GLYCOLAX) packet 17 g  17 g Oral Daily PRN Nicholaus Bloom, MD      . traZODone (DESYREL) tablet 50 mg  50 mg Oral QHS Nicholaus Bloom, MD   50 mg at 03/12/15 2103    Lab Results: No results found for this or any previous visit (from the past 48 hour(s)).  Physical Findings: AIMS: Facial and Oral Movements Muscles of Facial Expression: None, normal Lips and Perioral Area: None, normal Jaw: None, normal Tongue: None, normal,Extremity Movements Upper (arms, wrists, hands, fingers): None, normal Lower (legs, knees, ankles, toes): None, normal, Trunk Movements Neck, shoulders, hips: None, normal, Overall Severity Severity of abnormal movements (highest score from questions above): None, normal Incapacitation due to abnormal movements: None, normal Patient's awareness of abnormal movements (rate only patient's report): No Awareness, Dental Status Current problems with teeth and/or dentures?: No Does patient usually wear dentures?: No  CIWA:  CIWA-Ar Total: 0 COWS:     Musculoskeletal: Strength & Muscle Tone: within normal limits Gait & Station: normal Patient leans: normal  Psychiatric Specialty Exam: Review of Systems  Constitutional: Negative.   HENT: Negative.   Eyes: Negative.   Respiratory: Negative.   Cardiovascular: Negative.   Gastrointestinal: Negative.   Genitourinary: Negative.   Musculoskeletal: Negative.   Skin: Negative.   Neurological: Negative.   Endo/Heme/Allergies: Negative.   Psychiatric/Behavioral: Positive for  depression and substance abuse. The patient is nervous/anxious.     Blood pressure 126/85, pulse 82, temperature 98.3 F (36.8 C), temperature source Oral, resp. rate 16, height 5\' 9"  (1.753 m), weight 65.772 kg (145 lb), SpO2 100 %.Body mass index is 21.4 kg/(m^2).  General Appearance: Fairly Groomed  Engineer, water::  Fair  Speech:  Clear and Coherent  Volume:  Normal  Mood:  Anxious and worried  Affect:  anxious worried  Thought Process:  Coherent and Goal Directed  Orientation:  Full (Time, Place, and Person)  Thought Content:  symptoms events worries concerns  Suicidal Thoughts:  No  Homicidal Thoughts:  No  Memory:  Immediate;   Fair Recent;   Fair Remote;   Fair  Judgement:  Fair  Insight:  Present and Shallow  Psychomotor Activity:  Restlessness  Concentration:  Fair  Recall:  [employer] of Knowledge:Fair  Language: Fair  Akathisia:  No  Handed:  Right  AIMS (if indicated):     Assets:  Desire for Improvement  ADL's:  Intact  Cognition: WNL  Sleep:  Number of Hours: 5.5   Treatment Plan Summary: Daily contact with patient to assess and evaluate symptoms and progress in treatment and Medication management Supportive approach/coping skills Alcohol/cocaine abuse-dependence; work a relapse prevention plan Depression; continue the Prozac 20 mg daily, consider increasing to 30 mg daily Anxiety; work to identify and address his cognitive errors like catastrophic thinking Use CBT/mindfulness; breathing and other relaxation techniques  Facilitate admission to [facility] or [facility] ( they will have a bed on Monday) Coriana Angello A 03/13/2015, 4:21 PM

## 2015-03-13 NOTE — Progress Notes (Signed)
D: Per patient self inventory form pt reports he slept fair last night with the use of sleep medication. He reports a good appetite, normal energy level, good concentration. Pt rates depression 0/10, hopelessness 0/10, anxiety 0/10- all on 0-10 scale, 10 being the worse. Pt denies HI/SI. Pt denies AVH. Pt reports his goal for the day is "concentrating on getting out and contacting [facility], looking forward to continuing treatment." Pt reports he will "contact [facility]" to help meet his goal today. Pt joking with nursing staff. Voices no complaints at this time. Pt attended nursing group on the unit. Insightful in regards to battling with alcohol and drugs. Sharing and supportive during group. Pt denies physical pain. Pt denies s/s of withdrawal.   A:Special checks q 15 mins in place for safety. Medication administered per MD order(see eMAR) Encouragement and support provided.  R:Safety maintained. Compliant with medication regimen.  Will continue to monitor.

## 2015-03-13 NOTE — Progress Notes (Signed)
D: Patient alert and oriented x 4. Patient denies pain/SI/HI/AVH. Patient requested trazodone early, Medication was given at 2103. A: Staff to monitor Q 15 mins for safety. Encouragement and support offered. Scheduled medications administered per orders. R: Patient remains safe on the unit. Patient attended group tonight. Patient visible on hte unit and interacting with peers. Patient taking administered medications.

## 2015-03-13 NOTE — Clinical Social Work Note (Signed)
[facility] admissions coordinator called requesting that we fax MAR to [phone]. CSW faxed this at 3:55PM today.  Maxie Better, LCSWA Clinical Social Worker 03/13/2015 3:57 PM

## 2015-03-13 NOTE — BHH Group Notes (Signed)
[place] Group Notes:  (Nursing/MHT/Case Management/Adjunct)  Date:  03/13/2015  Time: 0915  Type of Therapy:  Nurse Education  Participation Level:  Active  Participation Quality:  Appropriate, Attentive, Sharing and Supportive  Affect:  Appropriate  Cognitive:  Alert and Appropriate  Insight:  Appropriate  Engagement in Group:  Engaged  Modes of Intervention:  Discussion, Education, Socialization and Support  Summary of Progress/Problems: Pt active and shared personal story in group. Pt reports he has been battling with alcohol and drugs.   Forrestine Him 03/13/2015, 10:20 AM

## 2015-03-13 NOTE — BHH Group Notes (Signed)
[place] LCSW Group Therapy  03/13/2015 11:38 AM  Type of Therapy:  Group Therapy  Participation Level:  Active  Participation Quality:  Attentive  Affect:  Appropriate  Cognitive:  Alert  Insight:  Engaged  Engagement in Therapy:  Engaged  Modes of Intervention:  Confrontation, Discussion, Education, Exploration, Problem-solving, Rapport Building, Socialization and Support  Summary of Progress/Problems:  Finding Balance in Life. Today's group focused on defining balance in one's own words, identifying things that can knock one off balance, and exploring healthy ways to maintain balance in life. Group members were asked to provide an example of a time when they felt off balance, describe how they handled that situation,and process healthier ways to regain balance in the future. Group members were asked to share the most important tool for maintaining balance that they learned while at [facility] and how they plan to apply this method after discharge. Kaz[place] was attentive and engaged during today's processing group. He shared that he feels off balance and thinks that further inpatient treatment is needed to help him overcome substance abuse addiction and 'get back on my feet." He reports feeling hopeful about [facility] at [facility] and reports a supportive brother and deacon "from my church."   Smart, [place] 03/13/2015, 11:38 AM

## 2015-03-14 LAB — CBC WITH DIFFERENTIAL/PLATELET
Basophils Absolute: 0.1 10*3/uL (ref 0.0–0.1)
Basophils Relative: 1 %
Eosinophils Absolute: 0.1 10*3/uL (ref 0.0–0.7)
Eosinophils Relative: 1 %
HEMATOCRIT: 29.4 % — AB (ref 39.0–52.0)
HEMOGLOBIN: 10.1 g/dL — AB (ref 13.0–17.0)
LYMPHS ABS: 1.6 10*3/uL (ref 0.7–4.0)
Lymphocytes Relative: 16 %
MCH: 33.2 pg (ref 26.0–34.0)
MCHC: 34.4 g/dL (ref 30.0–36.0)
MCV: 96.7 fL (ref 78.0–100.0)
MONO ABS: 0.7 10*3/uL (ref 0.1–1.0)
MONOS PCT: 7 %
NEUTROS ABS: 7.5 10*3/uL (ref 1.7–7.7)
NEUTROS PCT: 75 %
Platelets: 546 10*3/uL — ABNORMAL HIGH (ref 150–400)
RBC: 3.04 MIL/uL — ABNORMAL LOW (ref 4.22–5.81)
RDW: 14.5 % (ref 11.5–15.5)
WBC: 10 10*3/uL (ref 4.0–10.5)

## 2015-03-14 NOTE — Progress Notes (Signed)
[facility] MD Progress Note  03/14/2015 3:36 PM Jeremy [name]  MRN:  [id number] Subjective:  Jeremy [name] is going to be able to make it to the [facility]. He worked that program before and states he was able to do well and stay clean. He will be in [place] where he has his church and his brother. He states he will have more support out of there Principal Problem: MDD (major depressive disorder), recurrent severe, without psychosis ([place]) Diagnosis:   Patient Active Problem List   Diagnosis Date Noted  . Alcohol use disorder, severe, dependence ([place]) [F10.20] 03/11/2015  . Cocaine abuse [F14.10] 03/11/2015  . MDD (major depressive disorder), recurrent severe, without psychosis ([place]) [F33.2] 03/10/2015  . Hematemesis [K92.0]   . Protein-calorie malnutrition, severe ([place]) [E43] 03/03/2015  . Shock liver [K75.9] 03/02/2015  . Melena [K92.1] 03/02/2015  . Abnormal liver function tests [R79.89] 03/01/2015  . Pancreatitis [K85.9] 02/28/2015  . Acute alcoholic hepatitis [W09.81] 02/28/2015  . Acute kidney injury ([place]) [N17.9] 02/28/2015  . Alcohol abuse [F10.10] 02/28/2015  . Metabolic acidosis [X91.4] 02/28/2015  . Leukocytosis [D72.829] 02/28/2015  . Ketoacidosis [E87.2] 02/28/2015  . Lactic acidosis [E87.2] 02/28/2015   Total Time spent with patient: 30 minutes  Past Psychiatric History: See Admission H and P  Past Medical History:  Past Medical History  Diagnosis Date  . FH: kidney cancer   . Cancer [facility]) 2006    kidney cancer  . Chronic kidney disease     RCC, s/p resection in 2009    Past Surgical History  Procedure Laterality Date  . Nephrectomy    . Left lobe of lung removed  2006  . Ulcer surgery  2008  . Esophagogastroduodenoscopy (egd) with propofol N/A 03/04/2015    Procedure: ESOPHAGOGASTRODUODENOSCOPY (EGD) WITH PROPOFOL;  Surgeon: Ladene Artist, MD;  Location: [facility] ENDOSCOPY;  Service: Endoscopy;  Laterality: N/A;   Family History:  Family History  Problem Relation Age  of Onset  . Diabetes Father   . Alcohol abuse Other     "many people'   Family Psychiatric  History: see Admission H and P Social History:  History  Alcohol Use  . 20.4 oz/week  . 24 Cans of beer, 10 Standard drinks or equivalent per week     History  Drug Use  . Yes  . Special: Cocaine    Social History   Social History  . Marital Status: Divorced    Spouse Name: N/A  . Number of Children: N/A  . Years of Education: N/A   Social History Main Topics  . Smoking status: Former Smoker    Types: Cigarettes    Quit date: 06/19/2014  . Smokeless tobacco: Never Used  . Alcohol Use: 20.4 oz/week    24 Cans of beer, 10 Standard drinks or equivalent per week  . Drug Use: Yes    Special: Cocaine  . Sexual Activity: Not Asked   Other Topics Concern  . None   Social History Narrative   Additional Social History:                         Sleep: Fair  Appetite:  Fair  Current Medications: Current Facility-Administered Medications  Medication Dose Route Frequency Provider Last Rate Last Dose  . acetaminophen (TYLENOL) tablet 650 mg  650 mg Oral Q6H PRN Ambrose Finland, MD      . alum & mag hydroxide-simeth (MAALOX/MYLANTA) 200-200-20 MG/5ML suspension 30 mL  30 mL Oral Q4H PRN  Ambrose Finland, MD      . amoxicillin (AMOXIL) capsule 1,000 mg  1,000 mg Oral Q12H Nicholaus Bloom, MD   1,000 mg at 03/14/15 0809  . clarithromycin (BIAXIN) tablet 500 mg  500 mg Oral Q12H Nicholaus Bloom, MD   500 mg at 03/14/15 0809  . cloNIDine (CATAPRES) tablet 0.1 mg  0.1 mg Oral BID Nicholaus Bloom, MD   0.1 mg at 03/14/15 0835  . feeding supplement (ENSURE ENLIVE) (ENSURE ENLIVE) liquid 237 mL  237 mL Oral BID BM Nicholaus Bloom, MD   237 mL at 03/13/15 1056  . FLUoxetine (PROZAC) capsule 20 mg  20 mg Oral Daily Nicholaus Bloom, MD   20 mg at 03/14/15 0809  . folic acid (FOLVITE) tablet 1 mg  1 mg Oral Daily Nicholaus Bloom, MD   1 mg at 03/14/15 0809  . magnesium hydroxide  (MILK OF MAGNESIA) suspension 30 mL  30 mL Oral Daily PRN Ambrose Finland, MD   30 mL at 03/10/15 2001  . pantoprazole (PROTONIX) EC tablet 40 mg  40 mg Oral BID Nicholaus Bloom, MD   40 mg at 03/14/15 0809  . polyethylene glycol (MIRALAX / GLYCOLAX) packet 17 g  17 g Oral Daily PRN Nicholaus Bloom, MD      . traZODone (DESYREL) tablet 50 mg  50 mg Oral QHS Nicholaus Bloom, MD   50 mg at 03/13/15 2118    Lab Results: No results found for this or any previous visit (from the past 90 hour(s)).  Physical Findings: AIMS: Facial and Oral Movements Muscles of Facial Expression: None, normal Lips and Perioral Area: None, normal Jaw: None, normal Tongue: None, normal,Extremity Movements Upper (arms, wrists, hands, fingers): None, normal Lower (legs, knees, ankles, toes): None, normal, Trunk Movements Neck, shoulders, hips: None, normal, Overall Severity Severity of abnormal movements (highest score from questions above): None, normal Incapacitation due to abnormal movements: None, normal Patient's awareness of abnormal movements (rate only patient's report): No Awareness, Dental Status Current problems with teeth and/or dentures?: No Does patient usually wear dentures?: No  CIWA:  CIWA-Ar Total: 0 COWS:     Musculoskeletal: Strength & Muscle Tone: within normal limits Gait & Station: normal Patient leans: normal  Psychiatric Specialty Exam: Review of Systems  Constitutional: Negative.   HENT: Negative.   Eyes: Negative.   Respiratory: Negative.   Cardiovascular: Negative.   Gastrointestinal: Negative.   Genitourinary: Negative.   Musculoskeletal: Negative.   Skin: Negative.   Neurological: Negative.   Endo/Heme/Allergies: Negative.   Psychiatric/Behavioral: Positive for depression and substance abuse.    Blood pressure 136/93, pulse 96, temperature 98 F (36.7 C), temperature source Oral, resp. rate 16, height 5\' 9"  (1.753 m), weight 65.772 kg (145 lb), SpO2 100 %.Body mass  index is 21.4 kg/(m^2).  General Appearance: Fairly Groomed  Engineer, water::  Fair  Speech:  Clear and Coherent  Volume:  Normal  Mood:  Euthymic  Affect:  Appropriate  Thought Process:  Coherent and Goal Directed  Orientation:  Full (Time, Place, and Person)  Thought Content:  symptoms events worries concerns  Suicidal Thoughts:  No  Homicidal Thoughts:  No  Memory:  Immediate;   Fair Recent;   Fair Remote;   Fair  Judgement:  Fair  Insight:  Present  Psychomotor Activity:  Restlessness  Concentration:  Fair  Recall:  [employer] of Knowledge:Fair  Language: Fair  Akathisia:  No  Handed:  Right  AIMS (  if indicated):     Assets:  Desire for Improvement  ADL's:  Intact  Cognition: WNL  Sleep:  Number of Hours: 5.75   Treatment Plan Summary: Daily contact with patient to assess and evaluate symptoms and progress in treatment and Medication management Supportive approach, coping skills Depression; will continue the Prozac 20 mg daily Alcohol and cocaine dependence-abuse; continue to work a relapse prevention plan Low Hgb; will recheck the CBC Will work with CBT/mindfulnes Will facilitate being admitted to [facility]  [place] A 03/14/2015, 3:36 PM

## 2015-03-14 NOTE — Progress Notes (Signed)
Patient did not attend the evening karaoke group. Pt was notified that group was beginning but returned to his room. Pt was concerned that he would not be able to make it to the bathroom in time with his frequent urination. Pt was made aware there were bathrooms in the hallway but pt felt better about staying on unit.

## 2015-03-14 NOTE — BHH Group Notes (Signed)
[place] LCSW Group Therapy  03/14/2015 12:24 PM  Type of Therapy:  Group Therapy  Participation Level:  Active  Participation Quality:  Attentive  Affect:  Appropriate  Cognitive:  Alert and Oriented  Insight:  Engaged  Engagement in Therapy:  Engaged  Modes of Intervention:  Confrontation, Discussion, Education, Exploration, Problem-solving, Rapport Building, Socialization and Support  Summary of Progress/Problems: Feelings around Relapse. Group members discussed the meaning of relapse and shared personal stories of relapse, how it affected them and others, and how they perceived themselves during this time. Group members were encouraged to identify triggers, warning signs and coping skills used when facing the possibility of relapse. Social supports were discussed and explored in detail. Post Acute Withdrawal Syndrome (handout provided) was introduced and examined. Pt's were encouraged to ask questions, talk about key points associated with PAWS, and process this information in terms of relapse prevention. Jeremy [name] was attentive and engaged during today's processing group. He shared his latest relapse experience, stating that he brought home a beer for someone else, decided to drink it, "and before I knew it, I was drinking a gallon of straight liquor a day." Pt states that he is "one of those people that just has to stay away from alcohol all together." He continues to make progress in the group setting with improving insight.   Smart, Jeremy [name] LCSWA 03/14/2015, 12:24 PM

## 2015-03-14 NOTE — Clinical Social Work Note (Signed)
CSW spoke with Jenny Reichmann at [facility] and gave him pt's medication information per request. Call transferred to unit in order for John to schedule appt for admission with pt directly.   Maxie Better, LCSWA Clinical Social Worker 03/14/2015 3:20 PM

## 2015-03-14 NOTE — Progress Notes (Signed)
D: Pt has appropriate affect and pleasant mood.  He reports his day has "been great."  Pt reports his goal was to "get in touch with the treatment center" and that he accomplished his goal.  Pt denies SI/HI, denies hallucinations, denies pain.  Pt has been visible in milieu interacting with peers and staff appropriately.   A: Introduced self to pt.  Met with pt 1:1 and provided support and encouragement.  Actively listened to pt.  PRN medication administered for sleep.  Medications administered per order.  Educated pt on medication regimen.   R: Pt is compliant with medications.  Pt verbally contracts for safety.  Will continue to monitor and assess.

## 2015-03-14 NOTE — Plan of Care (Signed)
Problem: Alteration in mood & ability to function due to Goal: LTG-Pt reports reduction in suicidal thoughts (Patient reports reduction in suicidal thoughts and is able to verbalize a safety plan for whenever patient is feeling suicidal)  Outcome: Progressing Pt denies SI tonight.  He verbally contracts for safety.

## 2015-03-14 NOTE — Progress Notes (Signed)
D: Patient stated he slept good last night with the help of sleep medication. He states his energy level as normal and his concentration as good. He rates his depression at a 0, his anxiety at a 0, and his hopelessness at a 0. He denies any pain. He denies SI/HI/AVH. His goal today is to contact the treatment center . He is pleasant and visible in the milieu.  A: Medication was given and education was provided. Encouragement was given. R: Patient was cooperative and receptive. Patient was visible in the milieu. Patient went to groups.

## 2015-03-14 NOTE — Tx Team (Signed)
Interdisciplinary Treatment Plan Update (Adult)  Date:  03/14/2015  Time Reviewed:  4:21 PM   Progress in Treatment: Attending groups: Yes. Participating in groups:  Yes. Taking medication as prescribed:  Yes. Tolerating medication:  Yes. Family/Significant othe contact made: SPE completed with pt's deacon/friend. Patient understands diagnosis:  Yes. and As evidenced by:  seeking treatment for SI, depression, and alcohol abuse.  Discussing patient identified problems/goals with staff:  Yes. Medical problems stabilized or resolved:  Yes. Denies suicidal/homicidal ideation: Yes. Issues/concerns per patient self-inventory:  Other:  Discharge Plan or Barriers:    Pt and CSW working to get him into [facility] program. Spoke with Mudlogger who was transferred to unit to schedule appt with [place]. Appt to be scheduled with o/p provider in that area prior to d/c.   Reason for Continuation of Hospitalization: Depression Medication stabilization Withdrawal symptoms  Comments:  Pt admitted to [address] hall after being discharged from medical floor at [facility]. Per report, pt was still having suicidal thoughts. He was admitted to medical floor with possible pancreatitis, c-diff (only positive to antibodies with no active disease). He has history of kidney cancer. He recently lost his 58 year old grandmother who was a positive support person. His sister died recently of breast cancer and his mother was murdered by his step father "Years ago and I still carry that with me." He denies any suicidal thougths. No A/V hallucinations. He states that he has been off of alcohol for two weeks and hasn't smoked crack cocaine in three weeks  Estimated length of stay:  3 days (tentative d/c on Monday)   New goal(s): to develop effective aftercare plan.   Additional Comments:  Patient and CSW reviewed pt's identified goals and treatment plan. Patient verbalized understanding and agreed to treatment plan. CSW  reviewed [facility] "Discharge Process and Patient Involvement" Form. Pt verbalized understanding of information provided and signed form.    Review of initial/current patient goals per problem list:  1. Goal(s): Patient will participate in aftercare plan  Met: Yes  Target date: at discharge  As evidenced by: Patient will participate within aftercare plan AEB aftercare provider and housing plan at discharge being identified.  10/11: CSW assessing. Pt hopes to be accepted into [facility].  10/14: Pt going to [facility] program and is tentatively scheduled for Monday discharge. His deacon will transfer him to facility.   2. Goal (s): Patient will exhibit decreased depressive symptoms and suicidal ideations.  Met: Yes   Target date: at discharge  As evidenced by: Patient will utilize self rating of depression at 3 or below and demonstrate decreased signs of depression or be deemed stable for discharge by MD.  10/11: Pt rates depression as 7/10 and presents with depressed mood. Pt denies SI/HI/AVH.   10/14: Pt rates depression as low today with no SI/HI/AVH and presents with pleasant mood.    3. Goal(s): Patient will demonstrate decreased signs of withdrawal due to substance abuse  Met:Yes   Target date:at discharge   As evidenced by: Patient will produce a CIWA/COWS score of 0, have stable vitals signs, and no symptoms of withdrawal.  10/11: Pt reports no withdrawal symptoms with CIWA score of 0 and stable vitals.    Attendees: Patient:   03/14/2015 4:21 PM   Family:   03/14/2015 4:21 PM   Physician:  Dr. Carlton Adam, MD 03/14/2015 4:21 PM   Nursing:   Gretta Began RN 03/14/2015 4:21 PM   Clinical Social  Worker: [place], [place]  03/14/2015 4:21 PM   Clinical Social Worker: Erasmo Downer Drinkard LCSWA; Peri Maris LCSWA 03/14/2015 4:21 PM   Other:  Gerline Legacy Nurse Case Manager 03/14/2015 4:21 PM   Other:  Lucinda Dell; [place]  TCT  03/14/2015 4:21 PM   Other:   03/14/2015 4:21 PM   Other:  03/14/2015 4:21 PM   Other:  03/14/2015 4:21 PM   Other:  03/14/2015 4:21 PM    03/14/2015 4:21 PM    03/14/2015 4:21 PM    03/14/2015 4:21 PM    03/14/2015 4:21 PM    Scribe for Treatment Team:   Maxie Better, LCSWA  03/14/2015 4:21 PM

## 2015-03-14 NOTE — BHH Group Notes (Signed)
[facility] LCSW Aftercare Discharge Planning Group Note   03/14/2015 11:32 AM  Participation Quality:  Appropriate   Mood/Affect:  Appropriate  Depression Rating:  0  Anxiety Rating:  0  Thoughts of Suicide:  No Will you contract for safety?   NA  Current AVH:  No  Plan for Discharge/Comments:  Pt hoping for Monday discharge and is waiting for [facility] to contact him with appt date for admission. Pt feels hopeful about this plan.   Transportation Means: deacon of his church will transport him to [place].   Supports: deacon/Don; and pt's brother   Jeremy [name], Jeremy [name]

## 2015-03-14 NOTE — Progress Notes (Signed)
Pt attended Group Meeting and was appropriate.  

## 2015-03-15 MED ORDER — AMOXICILLIN 500 MG PO CAPS: 1000.0000 mg | ORAL_CAPSULE | Freq: Two times a day (BID) | ORAL | Status: AC

## 2015-03-15 MED ORDER — CLARITHROMYCIN 500 MG PO TABS: 500.0000 mg | ORAL_TABLET | Freq: Two times a day (BID) | ORAL | Status: AC

## 2015-03-15 NOTE — Progress Notes (Signed)
Patient ID: Jeremy [name], male   DOB: [birth year], 59 y.o.   MRN: [id number] Adult Psychoeducational Group Note  Date:  03/15/2015 Time: 09:15am  Group Topic/Focus:  Recovery Goals:   The focus of this group is to identify appropriate goals for recovery and establish a plan to achieve them.  Participation Level:  Active  Participation Quality:  Attentive and Resistant  Affect:  Flat  Cognitive:  Alert and Oriented  Insight: Improving  Engagement in Group:  Defensive and Engaged  Modes of Intervention:  Discussion, Education and Support  Additional Comments:  Pt was able to identify one daily goal to accomplish, pt states "I want to call my brother today, we haven't talked in a while."  [place], Coralee Pesa 03/15/2015, 11:09 AM

## 2015-03-15 NOTE — Progress Notes (Signed)
D: Jeremy [name] states today was fine. He is unsure if he will attend group tonight. Says "I know the routine". He denies SI/HI/AVH at this time. Awaiting admit to [facility] on Monday. Depression/Anxiety/Hopelessness all  0/10. A: Continue encouragement and support R: Continue to monitor for patient safety and medication effectiveness.

## 2015-03-15 NOTE — Progress Notes (Signed)
[facility] MD Progress Note  03/15/2015 5:49 PM Jeremy [name]  MRN:  [id number] Subjective:  Jeremy [name] is looking forward to go back to [facility]. States it worked for him before. He states he is starting to feel better. A minister from his church will pick him up Monday and take him to [place].  Principal Problem: MDD (major depressive disorder), recurrent severe, without psychosis (Jeremy [name]) Diagnosis:   Patient Active Problem List   Diagnosis Date Noted  . Alcohol use disorder, severe, dependence ([place]) [F10.20] 03/11/2015  . Cocaine abuse [F14.10] 03/11/2015  . MDD (major depressive disorder), recurrent severe, without psychosis ([place]) [F33.2] 03/10/2015  . Hematemesis [K92.0]   . Protein-calorie malnutrition, severe ([place]) [E43] 03/03/2015  . Shock liver [K75.9] 03/02/2015  . Melena [K92.1] 03/02/2015  . Abnormal liver function tests [R79.89] 03/01/2015  . Pancreatitis [K85.9] 02/28/2015  . Acute alcoholic hepatitis [Y63.78] 02/28/2015  . Acute kidney injury ([place]) [N17.9] 02/28/2015  . Alcohol abuse [F10.10] 02/28/2015  . Metabolic acidosis [H88.5] 02/28/2015  . Leukocytosis [D72.829] 02/28/2015  . Ketoacidosis [E87.2] 02/28/2015  . Lactic acidosis [E87.2] 02/28/2015   Total Time spent with patient: 20 minutes  Past Psychiatric History: see Admission H and P  Past Medical History:  Past Medical History  Diagnosis Date  . FH: kidney cancer   . Cancer [facility]) 2006    kidney cancer  . Chronic kidney disease     RCC, s/p resection in 2009    Past Surgical History  Procedure Laterality Date  . Nephrectomy    . Left lobe of lung removed  2006  . Ulcer surgery  2008  . Esophagogastroduodenoscopy (egd) with propofol N/A 03/04/2015    Procedure: ESOPHAGOGASTRODUODENOSCOPY (EGD) WITH PROPOFOL;  Surgeon: Ladene Artist, MD;  Location: [facility];  Service: Endoscopy;  Laterality: N/A;   Family History:  Family History  Problem Relation Age of Onset  . Diabetes Father   . Alcohol abuse Other      "many people'   Family Psychiatric  History: See H and P Social History:  History  Alcohol Use  . 20.4 oz/week  . 24 Cans of beer, 10 Standard drinks or equivalent per week     History  Drug Use  . Yes  . Special: Cocaine    Social History   Social History  . Marital Status: Divorced    Spouse Name: N/A  . Number of Children: N/A  . Years of Education: N/A   Social History Main Topics  . Smoking status: Former Smoker    Types: Cigarettes    Quit date: 06/19/2014  . Smokeless tobacco: Never Used  . Alcohol Use: 20.4 oz/week    24 Cans of beer, 10 Standard drinks or equivalent per week  . Drug Use: Yes    Special: Cocaine  . Sexual Activity: Not Asked   Other Topics Concern  . None   Social History Narrative   Additional Social History:                         Sleep: Fair  Appetite:  Fair  Current Medications: Current Facility-Administered Medications  Medication Dose Route Frequency Provider Last Rate Last Dose  . acetaminophen (TYLENOL) tablet 650 mg  650 mg Oral Q6H PRN Ambrose Finland, MD      . alum & mag hydroxide-simeth (MAALOX/MYLANTA) 200-200-20 MG/5ML suspension 30 mL  30 mL Oral Q4H PRN Ambrose Finland, MD      . amoxicillin (AMOXIL) capsule 1,000 mg  1,000 mg Oral Q12H Nicholaus Bloom, MD   1,000 mg at 03/15/15 0802  . clarithromycin (BIAXIN) tablet 500 mg  500 mg Oral Q12H Nicholaus Bloom, MD   500 mg at 03/15/15 0801  . cloNIDine (CATAPRES) tablet 0.1 mg  0.1 mg Oral BID Nicholaus Bloom, MD   0.1 mg at 03/15/15 1630  . feeding supplement (ENSURE ENLIVE) (ENSURE ENLIVE) liquid 237 mL  237 mL Oral BID BM Nicholaus Bloom, MD   237 mL at 03/13/15 1056  . FLUoxetine (PROZAC) capsule 20 mg  20 mg Oral Daily Nicholaus Bloom, MD   20 mg at 03/15/15 0801  . folic acid (FOLVITE) tablet 1 mg  1 mg Oral Daily Nicholaus Bloom, MD   1 mg at 03/15/15 0801  . magnesium hydroxide (MILK OF MAGNESIA) suspension 30 mL  30 mL Oral Daily PRN Ambrose Finland, MD   30 mL at 03/10/15 2001  . pantoprazole (PROTONIX) EC tablet 40 mg  40 mg Oral BID Nicholaus Bloom, MD   40 mg at 03/15/15 1629  . polyethylene glycol (MIRALAX / GLYCOLAX) packet 17 g  17 g Oral Daily PRN Nicholaus Bloom, MD      . traZODone (DESYREL) tablet 50 mg  50 mg Oral QHS Nicholaus Bloom, MD   50 mg at 03/14/15 2107    Lab Results:  Results for orders placed or performed during the hospital encounter of 03/10/15 (from the past 48 hour(s))  CBC with Differential/Platelet     Status: Abnormal   Collection Time: 03/14/15  6:41 PM  Result Value Ref Range   WBC 10.0 4.0 - 10.5 K/uL   RBC 3.04 (L) 4.22 - 5.81 MIL/uL   Hemoglobin 10.1 (L) 13.0 - 17.0 g/dL   HCT 29.4 (L) 39.0 - 52.0 %   MCV 96.7 78.0 - 100.0 fL   MCH 33.2 26.0 - 34.0 pg   MCHC 34.4 30.0 - 36.0 g/dL   RDW 14.5 11.5 - 15.5 %   Platelets 546 (H) 150 - 400 K/uL   Neutrophils Relative % 75 %   Neutro Abs 7.5 1.7 - 7.7 K/uL   Lymphocytes Relative 16 %   Lymphs Abs 1.6 0.7 - 4.0 K/uL   Monocytes Relative 7 %   Monocytes Absolute 0.7 0.1 - 1.0 K/uL   Eosinophils Relative 1 %   Eosinophils Absolute 0.1 0.0 - 0.7 K/uL   Basophils Relative 1 %   Basophils Absolute 0.1 0.0 - 0.1 K/uL    Comment: Performed at [facility]    Physical Findings: AIMS: Facial and Oral Movements Muscles of Facial Expression: None, normal Lips and Perioral Area: None, normal Jaw: None, normal Tongue: None, normal,Extremity Movements Upper (arms, wrists, hands, fingers): None, normal Lower (legs, knees, ankles, toes): None, normal, Trunk Movements Neck, shoulders, hips: None, normal, Overall Severity Severity of abnormal movements (highest score from questions above): None, normal Incapacitation due to abnormal movements: None, normal Patient's awareness of abnormal movements (rate only patient's report): No Awareness, Dental Status Current problems with teeth and/or dentures?: No Does patient usually wear  dentures?: No  CIWA:  CIWA-Ar Total: 0 COWS:  COWS Total Score: 0  Musculoskeletal: Strength & Muscle Tone: within normal limits Gait & Station: normal Patient leans: normal  Psychiatric Specialty Exam: Review of Systems  Constitutional: Negative.   HENT: Negative.   Eyes: Negative.   Respiratory: Negative.   Cardiovascular: Negative.   Gastrointestinal: Negative.   Genitourinary:  Negative.   Musculoskeletal: Negative.   Skin: Negative.   Neurological: Negative.   Endo/Heme/Allergies: Negative.   Psychiatric/Behavioral: Positive for substance abuse. The patient is nervous/anxious.     Blood pressure 115/68, pulse 102, temperature 97.7 F (36.5 C), temperature source Oral, resp. rate 16, height 5\' 9"  (1.753 m), weight 65.772 kg (145 lb), SpO2 100 %.Body mass index is 21.4 kg/(m^2).  General Appearance: Fairly Groomed  Engineer, water::  Fair  Speech:  Clear and Coherent  Volume:  Normal  Mood:  Anxious  Affect:  Appropriate  Thought Process:  Coherent and Goal Directed  Orientation:  Full (Time, Place, and Person)  Thought Content:  plans as he moves on, symptoms events worries concerns  Suicidal Thoughts:  No  Homicidal Thoughts:  No  Memory:  Immediate;   Fair Recent;   Fair Remote;   Fair  Judgement:  Fair  Insight:  Present  Psychomotor Activity:  Restlessness  Concentration:  Fair  Recall:  [employer] of Knowledge:Fair  Language: Fair  Akathisia:  No  Handed:  Right  AIMS (if indicated):     Assets:  Desire for Improvement Social Support  ADL's:  Intact  Cognition: WNL  Sleep:  Number of Hours: 6.25   Treatment Plan Summary: Daily contact with patient to assess and evaluate symptoms and progress in treatment and Medication management  Supportive approach/coping skills Alcohol cocaine abuse-dependence; continue to work a relapse prevention plan Depression; continue the Prozac at 20 mg daily Insomnia; continue the Trazodone 50 mg daily Facilitate admission  to [facility] Use CBT/mindfulness  Amante Fomby A 03/15/2015, 5:49 PM

## 2015-03-15 NOTE — Progress Notes (Signed)
Psychoeducational Group Note  Date: 03/15/2015 [id number]  Group Topic/Focus:  Identifying Needs:   The focus of this group is to help patients identify their personal needs that have been historically problematic and identify healthy behaviors to address their needs.  Participation Level:  Active  Participation Quality:  Appropriate  Affect:  Flat  Cognitive:  Oriented  Insight:  Improving  Engagement in Group:  Engaged  Additional Comments:  Pt attending the group and participated.  Jeremy [name]

## 2015-03-15 NOTE — Progress Notes (Signed)
D: Jeremy [name] states today was "great". He is working on identifying triggers to relapsing. He is also looking into different treatment programs. He denies SI/HI/AVH. He did attend group tonight. Says it was beneficial.  A: Encouragement and support given. R: Continue to monitor for patient safety and medication effectiveness.

## 2015-03-15 NOTE — [facility] Group Notes (Signed)
[facility] Group Notes: (Clinical Social Work)   03/15/2015      Type of Therapy:  Group Therapy   Participation Level:  Did Not Attend despite MHT prompting   Lakeita Panther Grossman-Orr, LCSW 03/15/2015, 1:12 PM     

## 2015-03-15 NOTE — Progress Notes (Addendum)
D: Patient states he slept good last night of sleep medication. He states his appetite has been good in the last 24 hours and describes his energy level as normal. He also describes his concentration as good. He denies depression, anxiety, and hopelessness. Patient denies pain. Patient denies SI/HI/AVH. His goal today is to contact his brother and he would also like the flu shot. A: Medication education was given. Order was put in for the flu shot. Encouragement was given to go to groups. R: Patient was cooperative with medication but did not want to take Ensure. He states he's too full when it's due. He attended groups.

## 2015-03-15 NOTE — Progress Notes (Signed)
Pt attended AA Group meeting. Pt was appropriate during the meeting.  

## 2015-03-16 MED ORDER — FLUOXETINE HCL 20 MG PO CAPS: 20.0000 mg | ORAL_CAPSULE | Freq: Every day | ORAL | Status: AC

## 2015-03-16 MED ORDER — PANTOPRAZOLE SODIUM 40 MG PO TBEC: 40.0000 mg | DELAYED_RELEASE_TABLET | Freq: Two times a day (BID) | ORAL | Status: AC

## 2015-03-16 MED ORDER — PNEUMOCOCCAL VAC POLYVALENT 25 MCG/0.5ML IJ INJ
0.5000 mL | INJECTION | Freq: Once | INTRAMUSCULAR | Status: AC
  Administered 2015-03-16: 0.5 mL via INTRAMUSCULAR

## 2015-03-16 MED ORDER — CLONIDINE HCL 0.1 MG PO TABS: 0.1000 mg | ORAL_TABLET | Freq: Two times a day (BID) | ORAL | Status: AC

## 2015-03-16 MED ORDER — INFLUENZA VAC SPLIT QUAD 0.5 ML IM SUSY: 0.5000 mL | PREFILLED_SYRINGE | INTRAMUSCULAR | Status: DC

## 2015-03-16 MED ORDER — TRAZODONE HCL 50 MG PO TABS: 50.0000 mg | ORAL_TABLET | Freq: Every day | ORAL | Status: AC

## 2015-03-16 MED ORDER — PNEUMOCOCCAL VAC POLYVALENT 25 MCG/0.5ML IJ INJ: 0.5000 mL | INJECTION | INTRAMUSCULAR | Status: DC

## 2015-03-16 MED ORDER — INFLUENZA VAC SPLIT QUAD 0.5 ML IM SUSY
0.5000 mL | PREFILLED_SYRINGE | Freq: Once | INTRAMUSCULAR | Status: AC
  Administered 2015-03-16: 0.5 mL via INTRAMUSCULAR
  Filled 2015-03-16: qty 0.5

## 2015-03-16 NOTE — Progress Notes (Signed)
[facility] MD Progress Note  03/16/2015 6:35 PM Jeremy [name]  MRN:  [id number] Subjective:  Jeremy [name] is getting ready to go to [facility]. He states he will not be able to make it without the further help he can get there. Expresses motivation to continue to work on his issues Principal Problem: MDD (major depressive disorder), recurrent severe, without psychosis ([place]) Diagnosis:   Patient Active Problem List   Diagnosis Date Noted  . Alcohol use disorder, severe, dependence ([place]) [F10.20] 03/11/2015  . Cocaine abuse [F14.10] 03/11/2015  . MDD (major depressive disorder), recurrent severe, without psychosis ([place]) [F33.2] 03/10/2015  . Hematemesis [K92.0]   . Protein-calorie malnutrition, severe ([place]) [E43] 03/03/2015  . Shock liver [K75.9] 03/02/2015  . Melena [K92.1] 03/02/2015  . Abnormal liver function tests [R79.89] 03/01/2015  . Pancreatitis [K85.9] 02/28/2015  . Acute alcoholic hepatitis [J68.11] 02/28/2015  . Acute kidney injury ([place]) [N17.9] 02/28/2015  . Alcohol abuse [F10.10] 02/28/2015  . Metabolic acidosis [X72.6] 02/28/2015  . Leukocytosis [D72.829] 02/28/2015  . Ketoacidosis [E87.2] 02/28/2015  . Lactic acidosis [E87.2] 02/28/2015   Total Time spent with patient: 30 minutes  Past Psychiatric History: see admission H and P  Past Medical History:  Past Medical History  Diagnosis Date  . FH: kidney cancer   . Cancer [facility]) 2006    kidney cancer  . Chronic kidney disease     RCC, s/p resection in 2009    Past Surgical History  Procedure Laterality Date  . Nephrectomy    . Left lobe of lung removed  2006  . Ulcer surgery  2008  . Esophagogastroduodenoscopy (egd) with propofol N/A 03/04/2015    Procedure: ESOPHAGOGASTRODUODENOSCOPY (EGD) WITH PROPOFOL;  Surgeon: Ladene Artist, MD;  Location: [facility];  Service: Endoscopy;  Laterality: N/A;   Family History:  Family History  Problem Relation Age of Onset  . Diabetes Father   . Alcohol abuse Other     "many people'    Family Psychiatric  History: see Admission H and P Social History:  History  Alcohol Use  . 20.4 oz/week  . 24 Cans of beer, 10 Standard drinks or equivalent per week     History  Drug Use  . Yes  . Special: Cocaine    Social History   Social History  . Marital Status: Divorced    Spouse Name: N/A  . Number of Children: N/A  . Years of Education: N/A   Social History Main Topics  . Smoking status: Former Smoker    Types: Cigarettes    Quit date: 06/19/2014  . Smokeless tobacco: Never Used  . Alcohol Use: 20.4 oz/week    24 Cans of beer, 10 Standard drinks or equivalent per week  . Drug Use: Yes    Special: Cocaine  . Sexual Activity: Not Asked   Other Topics Concern  . None   Social History Narrative   Additional Social History:                         Sleep: Poor  Appetite:  Fair  Current Medications: Current Facility-Administered Medications  Medication Dose Route Frequency Provider Last Rate Last Dose  . acetaminophen (TYLENOL) tablet 650 mg  650 mg Oral Q6H PRN Ambrose Finland, MD      . alum & mag hydroxide-simeth (MAALOX/MYLANTA) 200-200-20 MG/5ML suspension 30 mL  30 mL Oral Q4H PRN Ambrose Finland, MD      . amoxicillin (AMOXIL) capsule 1,000 mg  1,000 mg  Oral Q12H Nicholaus Bloom, MD   1,000 mg at 03/16/15 0818  . clarithromycin (BIAXIN) tablet 500 mg  500 mg Oral Q12H Nicholaus Bloom, MD   500 mg at 03/16/15 0818  . cloNIDine (CATAPRES) tablet 0.1 mg  0.1 mg Oral BID Nicholaus Bloom, MD   0.1 mg at 03/16/15 1647  . feeding supplement (ENSURE ENLIVE) (ENSURE ENLIVE) liquid 237 mL  237 mL Oral BID BM Nicholaus Bloom, MD   237 mL at 03/16/15 1458  . FLUoxetine (PROZAC) capsule 20 mg  20 mg Oral Daily Nicholaus Bloom, MD   20 mg at 03/16/15 0818  . folic acid (FOLVITE) tablet 1 mg  1 mg Oral Daily Nicholaus Bloom, MD   1 mg at 03/16/15 0818  . magnesium hydroxide (MILK OF MAGNESIA) suspension 30 mL  30 mL Oral Daily PRN Ambrose Finland, MD   30 mL at 03/10/15 2001  . pantoprazole (PROTONIX) EC tablet 40 mg  40 mg Oral BID Nicholaus Bloom, MD   40 mg at 03/16/15 1646  . polyethylene glycol (MIRALAX / GLYCOLAX) packet 17 g  17 g Oral Daily PRN Nicholaus Bloom, MD      . traZODone (DESYREL) tablet 50 mg  50 mg Oral QHS Nicholaus Bloom, MD   50 mg at 03/15/15 2109    Lab Results:  Results for orders placed or performed during the hospital encounter of 03/10/15 (from the past 48 hour(s))  CBC with Differential/Platelet     Status: Abnormal   Collection Time: 03/14/15  6:41 PM  Result Value Ref Range   WBC 10.0 4.0 - 10.5 K/uL   RBC 3.04 (L) 4.22 - 5.81 MIL/uL   Hemoglobin 10.1 (L) 13.0 - 17.0 g/dL   HCT 29.4 (L) 39.0 - 52.0 %   MCV 96.7 78.0 - 100.0 fL   MCH 33.2 26.0 - 34.0 pg   MCHC 34.4 30.0 - 36.0 g/dL   RDW 14.5 11.5 - 15.5 %   Platelets 546 (H) 150 - 400 K/uL   Neutrophils Relative % 75 %   Neutro Abs 7.5 1.7 - 7.7 K/uL   Lymphocytes Relative 16 %   Lymphs Abs 1.6 0.7 - 4.0 K/uL   Monocytes Relative 7 %   Monocytes Absolute 0.7 0.1 - 1.0 K/uL   Eosinophils Relative 1 %   Eosinophils Absolute 0.1 0.0 - 0.7 K/uL   Basophils Relative 1 %   Basophils Absolute 0.1 0.0 - 0.1 K/uL    Comment: Performed at [facility]    Physical Findings: AIMS: Facial and Oral Movements Muscles of Facial Expression: None, normal Lips and Perioral Area: None, normal Jaw: None, normal Tongue: None, normal,Extremity Movements Upper (arms, wrists, hands, fingers): None, normal Lower (legs, knees, ankles, toes): None, normal, Trunk Movements Neck, shoulders, hips: None, normal, Overall Severity Severity of abnormal movements (highest score from questions above): None, normal Incapacitation due to abnormal movements: None, normal Patient's awareness of abnormal movements (rate only patient's report): No Awareness, Dental Status Current problems with teeth and/or dentures?: No Does patient usually wear  dentures?: No  CIWA:  CIWA-Ar Total: 0 COWS:  COWS Total Score: 2  Musculoskeletal: Strength & Muscle Tone: within normal limits Gait & Station: normal Patient leans: normal  Psychiatric Specialty Exam: Review of Systems  Constitutional: Negative.   HENT: Negative.   Eyes: Negative.   Respiratory: Negative.   Cardiovascular: Negative.   Gastrointestinal: Negative.   Genitourinary: Negative.  Musculoskeletal: Negative.   Skin: Negative.   Neurological: Negative.   Endo/Heme/Allergies: Negative.   Psychiatric/Behavioral: Positive for depression and substance abuse.    Blood pressure 131/74, pulse 109, temperature 99 F (37.2 C), temperature source Oral, resp. rate 16, height 5\' 9"  (1.753 m), weight 65.772 kg (145 lb), SpO2 100 %.Body mass index is 21.4 kg/(m^2).  General Appearance: Fairly Groomed  Engineer, water::  Fair  Speech:  Clear and Coherent  Volume:  Normal  Mood:  Anxious  Affect:  Appropriate  Thought Process:  Coherent and Goal Directed  Orientation:  Full (Time, Place, and Person)  Thought Content:  plans as he moves on, relapse prevention plan  Suicidal Thoughts:  No  Homicidal Thoughts:  No  Memory:  Immediate;   Fair Recent;   Fair Remote;   Fair  Judgement:  Fair  Insight:  Present  Psychomotor Activity:  Restlessness  Concentration:  Fair  Recall:  [employer] of Knowledge:Fair  Language: Fair  Akathisia:  No  Handed:  Right  AIMS (if indicated):     Assets:  Desire for Improvement  ADL's:  Intact  Cognition: WNL  Sleep:  Number of Hours: 6.75   Treatment Plan Summary: Daily contact with patient to assess and evaluate symptoms and progress in treatment and Medication management Supportive approach/coping skills Alcohol cocaine dependence; continue to work a relapse prevention plan Depression; continue the Prozac 20 mg daily Use CBT/minfulness Facilitate getting to [facility] in the morning Davidmichael Zarazua A 03/16/2015, 6:35 PM

## 2015-03-16 NOTE — Progress Notes (Signed)
Pt did not attend the [place] speaker meeting. Pt stayed in his room reading.

## 2015-03-16 NOTE — B[facility] Group Notes (Addendum)
[place] Group Notes: (Clinical Social Work)   03/16/2015      Type of Therapy:  Group Therapy   Participation Level:  Did Not Attend despite MHT prompting   Selmer Dominion, LCSW 03/16/2015, 1:10 PM

## 2015-03-16 NOTE — Progress Notes (Signed)
Patient ID: Sender Rueb, male   DOB: February 03, 1956, 59 y.o.   MRN: [id number]  Adult Psychoeducational Group Note  Date:  03/16/2015 Time: 09:30am  Group Topic/Focus:  Making Healthy Choices:   The focus of this group is to help patients identify negative/unhealthy choices they were using prior to admission and identify positive/healthier coping strategies to replace them upon discharge.  Participation Level:  Did Not Attend  Participation Quality: n/a  Affect: n/a  Cognitive: n/a  Insight: n/a  Engagement in Group: n/a  Modes of Intervention:  Activity, Education and Support  Additional Comments:  Pt did not attend group. Pt awake.   Elenore Rota 03/16/2015, 10:07 AM

## 2015-03-16 NOTE — Progress Notes (Signed)
D) Pt has attended the groups and interacts with his peers. Affect is flat and mood is depressed. Pt was upset with this writer due to the fact he did not get his flu vaccine early this morning. Was able to verbalize his concerns appropriately. Rates his depression, hopelessness and anxiety as a 0. Denies SI and HI. States he is ready to go leave tomorrow and begin the rest of his treatment plans. A) Given support, reassurance and praise for his ability to speak with this writer appropriately and verbalize his concerns. Encouragement given. R) Denies SI and HI.

## 2015-03-16 NOTE — BHH Suicide Risk Assessment (Signed)
[facility] Discharge Suicide Risk Assessment   Demographic Factors:  Male  Total Time spent with patient: 30 minutes  Musculoskeletal: Strength & Muscle Tone: within normal limits Gait & Station: normal Patient leans: normal  Psychiatric Specialty Exam: Physical Exam  Review of Systems  Psychiatric/Behavioral: Positive for substance abuse.    Blood pressure 131/74, pulse 109, temperature 99 F (37.2 C), temperature source Oral, resp. rate 16, height 5\' 9"  (1.753 m), weight 65.772 kg (145 lb), SpO2 100 %.Body mass index is 21.4 kg/(m^2).  General Appearance: Fairly Groomed  Engineer, water::  Fair  Speech:  Clear and [id number]  Volume:  Normal  Mood:  Euthymic  Affect:  Appropriate  Thought Process:  Coherent and Goal Directed  Orientation:  Full (Time, Place, and Person)  Thought Content:  plans as he moves on, relapse prevention plan  Suicidal Thoughts:  No  Homicidal Thoughts:  No  Memory:  Immediate;   Fair Recent;   Fair Remote;   Fair  Judgement:  Fair  Insight:  Present  Psychomotor Activity:  Normal  Concentration:  Fair  Recall:  [employer] of Knowledge:Fair  Language: Fair  Akathisia:  No  Handed:  Right  AIMS (if indicated):     Assets:  Desire for Improvement  Sleep:  Number of Hours: 6.75  Cognition: WNL  ADL's:  Intact   Have you used any form of tobacco in the last 30 days? (Cigarettes, Smokeless Tobacco, Cigars, and/or Pipes): No  Has this patient used any form of tobacco in the last 30 days? (Cigarettes, Smokeless Tobacco, Cigars, and/or Pipes) No  Mental Status Per Nursing Assessment::   On Admission:  NA  Current Mental Status by Physician: In full contact with reality. There are no active S/S of withdrawal. There are no active SI plans or intent. He is willing and motivated to pursue further residential treatment   Loss Factors: NA  Historical Factors: NA  Risk Reduction Factors:   Sense of responsibility to family  Continued Clinical  Symptoms:  Depression:   Comorbid alcohol abuse/dependence Alcohol/Substance Abuse/Dependencies  Cognitive Features That Contribute To Risk:  Closed-mindedness, Polarized thinking and Thought constriction (tunnel vision)    Suicide Risk:  Minimal: No identifiable suicidal ideation.  Patients presenting with no risk factors but with morbid ruminations; may be classified as minimal risk based on the severity of the depressive symptoms  Principal Problem: MDD (major depressive disorder), recurrent severe, without psychosis [facility]) Discharge Diagnoses:  Patient Active Problem List   Diagnosis Date Noted  . Alcohol use disorder, severe, dependence ([place]) [F10.20] 03/11/2015  . Cocaine abuse [F14.10] 03/11/2015  . MDD (major depressive disorder), recurrent severe, without psychosis ([place]) [F33.2] 03/10/2015  . Hematemesis [K92.0]   . Protein-calorie malnutrition, severe ([place]) [E43] 03/03/2015  . Shock liver [K75.9] 03/02/2015  . Melena [K92.1] 03/02/2015  . Abnormal liver function tests [R79.89] 03/01/2015  . Pancreatitis [K85.9] 02/28/2015  . Acute alcoholic hepatitis [Z66.06] 02/28/2015  . Acute kidney injury ([place]) [N17.9] 02/28/2015  . Alcohol abuse [F10.10] 02/28/2015  . Metabolic acidosis [T01.6] 02/28/2015  . Leukocytosis [D72.829] 02/28/2015  . Ketoacidosis [E87.2] 02/28/2015  . Lactic acidosis [E87.2] 02/28/2015    Follow-up Information    Follow up with [facility].   Contact information:   [address] [place] [address] Phone: [phone] Fax: [phone]      Follow up with [facility].   Why:  Referral sent per pt request on 03/12/15.   Contact information:   Development worker, international aid  [address] [place], [place] [address] Phone: [phone] Fax: [phone]      Plan Of Care/Follow-up recommendations:  Activity:  as tolerated Diet:  regular Follow up as above Is patient on multiple antipsychotic therapies at discharge:  No   Has Patient had  three or more failed trials of antipsychotic monotherapy by history:  No  Recommended Plan for Multiple Antipsychotic Therapies: NA    Reham Slabaugh A 03/16/2015, 6:42 PM
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
## 2015-03-17 NOTE — Progress Notes (Signed)
  [facility] Adult Case Management Discharge Plan :  Will you be returning to the same living situation after discharge:  No.Pt plans to go to [facility] for [facility] in [place], [place].  At discharge, do you have transportation home?: Yes,  deacon from church Do you have the ability to pay for your medications: Yes,  mental health  Release of information consent forms completed and submitted to medical records by CSW. Patient to Follow up at: Follow-up Information    Follow up with [facility] On 03/17/2015.   Why:  Appt for admission on this day (30 day medication supply needed).    Contact information:   [address], [place] [address] Phone: [phone] Fax: [phone]      Patient denies SI/HI: Yes,  during group/self report.     Safety Planning and Suicide Prevention discussed: Yes,  SPE completed with pt's deacon. SPI pamphlet provided to pt and he was encouraged to share information with support network.   Have you used any form of tobacco in the last 30 days? (Cigarettes, Smokeless Tobacco, Cigars, and/or Pipes): No  Has patient been referred to the Quitline?: N/A patient is not a smoker  [employer]  03/17/2015, 10:49 AM

## 2015-03-17 NOTE — Progress Notes (Signed)
D: Patient alert and oriented x 4. Patient denies pain/SI/HI/AVH. Patient refused trazodone before bed saying, "I am going to try to sleep without it."  A: Staff to monitor Q 15 mins for safety. Encouragement and support offered. Scheduled medications administered per orders. R: Patient remains safe on the unit. Patient attended group tonight. Patient visible on hte unit and interacting with peers. Patient taking administered medications.
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
## 2015-03-17 NOTE — Progress Notes (Signed)
Pt d/c to care of his pastor, to be admitted to tx program in [place] on 03/20/15. Pt belongings returned. F/u info, rx's, and sample medications given and reviewed. Pt verbalizes understanding.

## 2019-02-07 ENCOUNTER — Encounter (HOSPITAL_COMMUNITY): Payer: Self-pay | Admitting: Emergency Medicine

## 2019-02-07 ENCOUNTER — Other Ambulatory Visit: Payer: Self-pay

## 2019-02-07 ENCOUNTER — Emergency Department (HOSPITAL_COMMUNITY)
Admission: EM | Admit: 2019-02-07 | Discharge: 2019-02-08 | Disposition: A | Discharge status: Home / Self Care | Payer: Medicare Other | Attending: Emergency Medicine | Admitting: Emergency Medicine

## 2019-02-07 DIAGNOSIS — R45851 Suicidal ideations: Secondary | ICD-10-CM

## 2019-02-07 DIAGNOSIS — Z79899 Other long term (current) drug therapy: Secondary | ICD-10-CM | POA: Insufficient documentation

## 2019-02-07 DIAGNOSIS — F14129 Cocaine abuse with intoxication, unspecified: Secondary | ICD-10-CM | POA: Insufficient documentation

## 2019-02-07 DIAGNOSIS — Y908 Blood alcohol level of 240 mg/100 ml or more: Secondary | ICD-10-CM | POA: Diagnosis not present

## 2019-02-07 DIAGNOSIS — F1410 Cocaine abuse, uncomplicated: Secondary | ICD-10-CM

## 2019-02-07 DIAGNOSIS — F10920 Alcohol use, unspecified with intoxication, uncomplicated: Secondary | ICD-10-CM

## 2019-02-07 DIAGNOSIS — Z87891 Personal history of nicotine dependence: Secondary | ICD-10-CM | POA: Insufficient documentation

## 2019-02-07 DIAGNOSIS — F10929 Alcohol use, unspecified with intoxication, unspecified: Secondary | ICD-10-CM | POA: Insufficient documentation

## 2019-02-07 DIAGNOSIS — F329 Major depressive disorder, single episode, unspecified: Secondary | ICD-10-CM | POA: Diagnosis present

## 2019-02-07 LAB — COMPREHENSIVE METABOLIC PANEL
ALT: 26 U/L (ref 0–44)
AST: 43 U/L — ABNORMAL HIGH (ref 15–41)
Albumin: 4.4 g/dL (ref 3.5–5.0)
Alkaline Phosphatase: 77 U/L (ref 38–126)
Anion gap: 12 (ref 5–15)
BUN: 8 mg/dL (ref 8–23)
CO2: 28 mmol/L (ref 22–32)
Calcium: 8.9 mg/dL (ref 8.9–10.3)
Chloride: 101 mmol/L (ref 98–111)
Creatinine, Ser: 1.03 mg/dL (ref 0.61–1.24)
GFR calc Af Amer: >60 mL/min (ref >60)
GFR calc non Af Amer: >60 mL/min (ref >60)
Glucose, Bld: 106 mg/dL — ABNORMAL HIGH (ref 70–99)
Potassium: 4 mmol/L (ref 3.5–5.1)
Sodium: 141 mmol/L (ref 135–145)
Total Bilirubin: 1.1 mg/dL (ref 0.3–1.2)
Total Protein: 7.5 g/dL (ref 6.5–8.1)

## 2019-02-07 LAB — CBC
HCT: 38.5 % — ABNORMAL LOW (ref 39.0–52.0)
Hemoglobin: 13.4 g/dL (ref 13.0–17.0)
MCH: 32.8 pg (ref 26.0–34.0)
MCHC: 34.8 g/dL (ref 30.0–36.0)
MCV: 94.1 fL (ref 80.0–100.0)
Platelets: 278 10*3/uL (ref 150–400)
RBC: 4.09 MIL/uL — ABNORMAL LOW (ref 4.22–5.81)
RDW: 13.5 % (ref 11.5–15.5)
WBC: 5.7 10*3/uL (ref 4.0–10.5)
nRBC: 0 % (ref 0.0–0.2)

## 2019-02-07 LAB — RAPID URINE DRUG SCREEN, HOSP PERFORMED
Amphetamines: NOT DETECTED
Barbiturates: NOT DETECTED
Benzodiazepines: NOT DETECTED
Cocaine: POSITIVE — AB
Opiates: NOT DETECTED
Tetrahydrocannabinol: NOT DETECTED

## 2019-02-07 LAB — ETHANOL: Alcohol, Ethyl (B): 428 mg/dL (ref <10)

## 2019-02-07 LAB — ACETAMINOPHEN LEVEL: Acetaminophen (Tylenol), Serum: <10 ug/mL — ABNORMAL LOW (ref 10–30)

## 2019-02-07 LAB — SALICYLATE LEVEL: Salicylate Lvl: <7 mg/dL (ref 2.8–30.0)

## 2019-02-07 NOTE — BH Assessment (Signed)
Tele Assessment Note   Patient Name: Jeremy [name] MRN: [id number] Referring Physician: E. Eulis Foster, MD Location of Patient: [facility] Location of Provider: [place] Department  Jeremy [name] is a 63 y.o. male who presented to [facility] (transported by police) after he called police and asked them to find him in his car on the side of a road.  When police arrived, he made statements about being ready to move on to the next world.  Pt also admitted to alcohol use.  Pt stated that he lives alone in [place], that he receives SSI payments, and that he is not followed by any outpatient psychiatrist or therapy services.  Author assessed Pt..  Pt was angry and yelled.  He stated, ''I can see your ugly face.''  When asked about suicidal ideation, Pt replied, ''What do I know about suicide?  I'm drunk.''  When asked what brought him to the hospital, Pt stated again, ''I'm drunk!''  When asked about homicidal ideation, Pt stated, ''I will punch you.''  Pt's BAC on admission was 429.  UDS was positive for cocaine. Pt was agitated and moved around the room.  He stated several times, ''I'm drunk!"  Further questions appeared to aggravate Pt.    During assessment, Pt presented as alert and argumentative.  Demeanor was restless.  He was dressed in scrubs, and he appeared appropriately groomed.  Pt's mood and affect were angry.  Speech was loud and threatening.  Pt's thought processes were within normal range, and thought content was goal-oriented.  There was no evidence of delusion, and Pt did not appear to respond to internal stimuli.  Memory and concentration could not be adequately assessed.  Pt's insight, judgment, and impulse control were deemed fair to poor.  Consulted with Peri Maris, DO, who recommended that Pt remain in the ED, sober, and then have evaluation in AM.  Diagnosis: Alcohol Intoxication  Past Medical History:  Past Medical History:  Diagnosis Date  . Cancer [facility]) 2006   kidney cancer  .  Chronic kidney disease    RCC, s/p resection in 2009  . FH: kidney cancer     Past Surgical History:  Procedure Laterality Date  . ESOPHAGOGASTRODUODENOSCOPY (EGD) WITH PROPOFOL N/A 03/04/2015   Procedure: ESOPHAGOGASTRODUODENOSCOPY (EGD) WITH PROPOFOL;  Surgeon: Ladene Artist, MD;  [facility];  Service: Endoscopy;  Laterality: N/A;  . left lobe of lung removed  2006  . NEPHRECTOMY    . ulcer surgery  2008    Family History:  Family History  Problem Relation Age of Onset  . Diabetes Father   . Alcohol abuse Other        "many people'    Social History:  reports that he quit smoking about 4 years ago. His smoking use included cigarettes. He has never used smokeless tobacco. He reports current alcohol use of about 34.0 standard drinks of alcohol per week. He reports current drug use. Drug: Cocaine.  Additional Social History:  Alcohol / Drug Use Pain Medications: See MAR Prescriptions: See MAR Over the Counter: See MAR History of alcohol / drug use?: Yes Substance #1 Name of Substance 1: Alcohol 1 - Amount (size/oz): Unknown 1 - Frequency: UTA 1 - Duration: UTA 1 - Last Use / Amount: 02/07/2019 -- BAC was 429 on admission Substance #2 Name of Substance 2: Cocaine 2 - Amount (size/oz): UTA 2 - Frequency: UTA 2 - Duration: UTA 2 - Last Use / Amount: 02/06/2019 -- Unknown  CIWA: CIWA-Ar BP: 127/89  Pulse Rate: 83 COWS:    Allergies: No Known Allergies  Home Medications: (Not in a hospital admission)   OB/GYN Status:  No LMP for male patient.  General Assessment Data [facility] of Assessment: [facility] TTS Assessment: In system Is this a Tele or Face-to-Face Assessment?: Tele Assessment Is this an Initial Assessment or a Re-assessment for this encounter?: Initial Assessment Patient Accompanied by:: N/A Language Other than English: No Living Arrangements: Other (Comment) What gender do you identify as?: Male Marital status: Divorced Pregnancy Status:  No Living Arrangements: Alone Can pt return to current living arrangement?: Yes Admission Status: Voluntary Is patient capable of signing voluntary admission?: Yes Referral Source: Self/Family/Friend Insurance type: [employer] Living Arrangements: Alone Name of Psychiatrist: None Name of Therapist: None     Risk to self with the past 6 months Suicidal Ideation: No(UTA -- ''What do I know about suicide?") Suicidal Plan?: No What has been your use of drugs/alcohol within the last 12 months?: Alcohol, cocaine Other Self Harm Risks: Alcohol use Recent stressful life event(s): (UTA) Depression Symptoms: Feeling angry/irritable Substance abuse history and/or treatment for substance abuse?: Yes  Risk to Others within the past 6 months Homicidal Ideation: No Does patient have any lifetime risk of violence toward others beyond the six months prior to admission? : Unknown  Psychosis Hallucinations: None noted Delusions: None noted  Mental Status Report Appearance/Hygiene: Unremarkable, In scrubs Eye Contact: Fair Motor Activity: Agitation Speech: Aggressive Level of Consciousness: Combative Mood: Angry Affect: Angry Anxiety Level: None Thought Processes: Unable to Assess Judgement: Unable to Assess Orientation: Unable to assess Obsessive Compulsive Thoughts/Behaviors: None  Cognitive Functioning Concentration: Poor Memory: Unable to Assess Is patient IDD: No Insight: Poor Impulse Control: Poor Sleep: Unable to Assess  ADLScreening [facility] Assessment Services) Patient's cognitive ability adequate to safely complete daily activities?: Yes Patient able to express need for assistance with ADLs?: Yes Independently performs ADLs?: Yes (appropriate for developmental age)  Prior Inpatient Therapy Prior Inpatient Therapy: (UTA)  Prior Outpatient Therapy Prior Outpatient Therapy: (UTA)  ADL Screening (condition at time of admission) Patient's cognitive  ability adequate to safely complete daily activities?: Yes Is the patient deaf or have difficulty hearing?: No Does the patient have difficulty seeing, even when wearing glasses/contacts?: No Does the patient have difficulty concentrating, remembering, or making decisions?: No Patient able to express need for assistance with ADLs?: Yes Does the patient have difficulty dressing or bathing?: No Independently performs ADLs?: Yes (appropriate for developmental age) Does the patient have difficulty walking or climbing stairs?: No Weakness of Legs: None Weakness of Arms/Hands: None  Home Assistive Devices/Equipment Home Assistive Devices/Equipment: None  Therapy Consults (therapy consults require a physician order) PT Evaluation Needed: No OT Evalulation Needed: No Abuse/Neglect Assessment (Assessment to be complete while patient is alone) Abuse/Neglect Assessment Can Be Completed: Yes Physical Abuse: Denies Verbal Abuse: Denies Sexual Abuse: Denies Exploitation of patient/patient's resources: Denies Self-Neglect: Denies Values / Beliefs Cultural Requests During Hospitalization: None Spiritual Requests During Hospitalization: None Consults Spiritual Care Consult Needed: No Social Work Consult Needed: No Regulatory affairs officer (For Healthcare) Does Patient Have a Medical Advance Directive?: Unable to assess, patient is non-responsive or altered mental status Would patient like information on creating a medical advance directive?: No - Patient declined          Disposition:  Disposition Initial Assessment Completed for this Encounter: Yes Patient referred to: Other (Comment)(AM psych eval following sober)  This service was provided via telemedicine  using a 2-way, interactive audio and Radiographer, therapeutic.  Names of all persons participating in this telemedicine service and their role in this encounter. Name: Jeremy [name] Role: Pt             Marlowe Aschoff 02/07/2019 5:33 PM

## 2019-02-07 NOTE — ED Provider Notes (Signed)
[facility] DEPT Provider Note   CSN: [id number] Arrival date & time: 02/07/19  1326     History   Chief Complaint Chief Complaint  Patient presents with  . Medical Clearance    HPI Jeremy [name] is a 63 y.o. male.     HPI   Patient was in his car on the side road when he called police to assist him.  They felt he was at risk for suicide so brought him here.  He is not currently committed.  Patient admits to drinking a lot of medicine and not taking medications for bipolar.  He wants to go to mental health facility so he can get started back on his medications.  He denies recent problems including fever, cough, chest pain, weakness or dizziness.  States he is currently hungry.  There are no other known modifying factors.  Past Medical History:  Diagnosis Date  . Cancer [facility]) 2006   kidney cancer  . Chronic kidney disease    RCC, s/p resection in 2009  . FH: kidney cancer     Patient Active Problem List   Diagnosis Date Noted  . Alcohol use disorder, severe, dependence ([place]) 03/11/2015  . Cocaine abuse ([place]) 03/11/2015  . MDD (major depressive disorder), recurrent severe, without psychosis ([place]) 03/10/2015  . Hematemesis   . Protein-calorie malnutrition, severe ([place]) 03/03/2015  . Shock liver 03/02/2015  . Melena 03/02/2015  . Abnormal liver function tests 03/01/2015  . Pancreatitis 02/28/2015  . Acute alcoholic hepatitis 0000000  . Acute kidney injury ([place]) 02/28/2015  . Alcohol abuse 02/28/2015  . Metabolic acidosis 0000000  . Leukocytosis 02/28/2015  . Ketoacidosis 02/28/2015  . Lactic acidosis 02/28/2015    Past Surgical History:  Procedure Laterality Date  . ESOPHAGOGASTRODUODENOSCOPY (EGD) WITH PROPOFOL N/A 03/04/2015   Procedure: ESOPHAGOGASTRODUODENOSCOPY (EGD) WITH PROPOFOL;  Surgeon: Ladene Artist, MD;  [facility];  Service: Endoscopy;  Laterality: N/A;  . left lobe of lung removed  2006  . NEPHRECTOMY     . ulcer surgery  2008        Home Medications    Prior to Admission medications   Medication Sig Start Date End Date Taking? Authorizing Provider  amoxicillin (AMOXIL) 500 MG capsule Take 2 capsules (1,000 mg total) by mouth every 12 (twelve) hours. Until gone 03/15/15   Nicholaus Bloom, MD  clarithromycin (BIAXIN) 500 MG tablet Take 1 tablet (500 mg total) by mouth every 12 (twelve) hours. Until gone 03/15/15   Nicholaus Bloom, MD  cloNIDine (CATAPRES) 0.1 MG tablet Take 1 tablet (0.1 mg total) by mouth 2 (two) times daily. 03/16/15   Nicholaus Bloom, MD  FLUoxetine (PROZAC) 20 MG capsule Take 1 capsule (20 mg total) by mouth daily. 03/16/15   Nicholaus Bloom, MD  pantoprazole (PROTONIX) 40 MG tablet Take 1 tablet (40 mg total) by mouth 2 (two) times daily. 03/16/15   Nicholaus Bloom, MD  traZODone (DESYREL) 50 MG tablet Take 1 tablet (50 mg total) by mouth at bedtime. 03/16/15   Nicholaus Bloom, MD    Family History Family History  Problem Relation Age of Onset  . Diabetes Father   . Alcohol abuse Other        "many people'    Social History Social History   Tobacco Use  . Smoking status: Former Smoker    Types: Cigarettes    Quit date: 06/19/2014    Years since quitting: 4.6  . Smokeless tobacco:  Never Used  Substance Use Topics  . Alcohol use: Yes    Alcohol/week: 34.0 standard drinks    Types: 24 Cans of beer, 10 Standard drinks or equivalent per week  . Drug use: Yes    Types: Cocaine     Allergies   Patient has no known allergies.   Review of Systems Review of Systems  All other systems reviewed and are negative.    Physical Exam Updated Vital Signs BP 127/89 (BP Location: Right Arm)   Pulse 83   Temp 97.8 F (36.6 C) (Oral)   Resp 20   SpO2 100%   Physical Exam Vitals signs and nursing note reviewed.  Constitutional:      General: He is not in acute distress.    Appearance: He is well-developed. He is not ill-appearing, toxic-appearing or  diaphoretic.     Comments: He appears under nourished  HENT:     Head: Normocephalic and atraumatic.     Right Ear: External ear normal.     Left Ear: External ear normal.     Nose: No congestion or rhinorrhea.     Mouth/Throat:     Pharynx: No oropharyngeal exudate or posterior oropharyngeal erythema.  Eyes:     Conjunctiva/sclera: Conjunctivae normal.     Pupils: Pupils are equal, round, and reactive to light.  Neck:     Musculoskeletal: Normal range of motion and neck supple.     Trachea: Phonation normal.  Cardiovascular:     Rate and Rhythm: Normal rate and regular rhythm.     Heart sounds: Normal heart sounds.  Pulmonary:     Effort: Pulmonary effort is normal.     Breath sounds: Normal breath sounds.  Abdominal:     General: There is no distension.     Palpations: Abdomen is soft.     Tenderness: There is no abdominal tenderness.  Musculoskeletal: Normal range of motion.        General: No swelling or tenderness.  Skin:    General: Skin is warm and dry.  Neurological:     Mental Status: He is alert and oriented to person, place, and time.     Cranial Nerves: No cranial nerve deficit.     Sensory: No sensory deficit.     Motor: No abnormal muscle tone.     Coordination: Coordination normal.  Psychiatric:        Mood and Affect: Mood normal.        Behavior: Behavior normal.        Thought Content: Thought content normal.        Judgment: Judgment normal.      ED Treatments / Results  Labs (all labs ordered are listed, but only abnormal results are displayed) Labs Reviewed  COMPREHENSIVE METABOLIC PANEL - Abnormal; Notable for the following components:      Result Value   Glucose, Bld 106 (*)    AST 43 (*)    All other components within normal limits  ETHANOL - Abnormal; Notable for the following components:   Alcohol, Ethyl (B) 428 (*)    All other components within normal limits  ACETAMINOPHEN LEVEL - Abnormal; Notable for the following components:    Acetaminophen (Tylenol), Serum <10 (*)    All other components within normal limits  CBC - Abnormal; Notable for the following components:   RBC 4.09 (*)    HCT 38.5 (*)    All other components within normal limits  RAPID URINE DRUG SCREEN, HOSP PERFORMED -  Abnormal; Notable for the following components:   Cocaine POSITIVE (*)    All other components within normal limits  SALICYLATE LEVEL    EKG None  Radiology No results found.  Procedures Procedures (including critical care time)  Medications Ordered in ED Medications - No data to display   Initial Impression / Assessment and Plan / ED Course  I have reviewed the triage vital signs and the nursing notes.  Pertinent labs & imaging results that were available during my care of the patient were reviewed by me and considered in my medical decision making (see chart for details).  Clinical Course as of Feb 07 1440  Wed Feb 07, 2019  1435 Normal  Acetaminophen level(!) [EW]  1435 Elevated  Ethanol(!!) [EW]  1435 Normal  cbc(!) [EW]  99991111 Normal  Salicylate level [EW]  1435 Normal except glucose elevated, AST high  Comprehensive metabolic panel(!) [EW]  99991111 Normal except cocaine present  Rapid urine drug screen (hospital performed)(!) [EW]    Clinical Course User Index [EW] Daleen Bo, MD        Patient Vitals for the past 24 hrs:  BP Temp Temp src Pulse Resp SpO2  02/07/19 1342 127/89 97.8 F (36.6 C) Oral 83 20 100 %  02/07/19 1334 - - - - - 95 %   TTS consultation   Medical Decision Making: Alcoholism suicidal ideation.  Initial vital signs are reassuring.  Screening labs ordered.  Patient is voluntary for any help for his psychiatric illness.  CRITICAL CARE-no Performed by: Daleen Bo  Nursing Notes Reviewed/ Care Coordinated Applicable Imaging Reviewed Interpretation of Laboratory Data incorporated into ED treatment  Plan-disposition after evaluation by TTS  Final Clinical Impressions(s)  / ED Diagnoses   Final diagnoses:  Suicidal ideation  Alcoholic intoxication without complication ([place])  Cocaine abuse [facility])    ED Discharge Orders    None       Daleen Bo, MD 02/07/19 1442

## 2019-02-07 NOTE — ED Triage Notes (Signed)
EMS called by [employer] because pt was on the side of the road. Called for well-fare check. He told them that he has suicidal thoughts and he is ready for his next life. Admits to some alcohol use - 3 or 4 pints Bud Beer. There is liquor in his book bag.

## 2019-02-08 NOTE — Discharge Instructions (Addendum)
Try to avoid drinking alcohol and using cocaine.  Follow-up with a doctor if needed for problems.  To find a behavioral health provider in your community, contact [place].  Please note that the phone number listed below also serves as a 24 hour crisis number        [employer]      [phone]

## 2019-02-08 NOTE — ED Notes (Signed)
Walked out at discharge with NT.  In stable condition.  Refused ride assistance.

## 2019-02-08 NOTE — BH Assessment (Signed)
[facility] Assessment Progress Note  This voluntary pt is no longer willing to wait at [facility] to be seen by a psychiatrist.  Per pt's nurse, Debbie, EDP Daleen Bo, MD will be discharging pt immanently.  This Probation officer has included contact information for [employer] in pt's discharge instructions, noting that the phone number serves as a 24 hour crisis number.  Jackelyn Poling has been notified, as has Jinny Blossom, [place].  Jalene Mullet, [place] Coordinator [phone]

## 2019-02-08 NOTE — ED Provider Notes (Signed)
9:50 AM-patient indicated to nursing that he wanted to leave.  He was seen overnight by TTS who plan on reevaluating him in the morning for possibility of suicidal ideation.  Patient seen by me yesterday, brought in by police, voluntarily, was very intoxicated.  Patient was rude and disrespectful when he talked to TTS overnight.  At this time patient is alert, cooperative and respectful.  He states that he plans on going back to [place].  He now states he does not have a car here in [place].  He denies suicidal ideation at this time.  He expresses no other needs or concerns, and is discharged.   Daleen Bo, MD 02/08/19 1001
